# Patient Record
Sex: Female | Born: 1940
Health system: Southern US, Community
[De-identification: ages and names within clinical notes are randomized; demographics above are authoritative.]

## PROBLEM LIST (undated history)

## (undated) DIAGNOSIS — M8580 Other specified disorders of bone density and structure, unspecified site: Secondary | ICD-10-CM

## (undated) DIAGNOSIS — E785 Hyperlipidemia, unspecified: Secondary | ICD-10-CM

## (undated) DIAGNOSIS — K219 Gastro-esophageal reflux disease without esophagitis: Secondary | ICD-10-CM

## (undated) DIAGNOSIS — M810 Age-related osteoporosis without current pathological fracture: Secondary | ICD-10-CM

## (undated) HISTORY — DX: Hyperlipidemia, unspecified: E78.5

## (undated) HISTORY — DX: Other specified disorders of bone density and structure, unspecified site: M85.80

## (undated) HISTORY — PX: ABDOMINAL HYSTERECTOMY: SHX81

## (undated) HISTORY — DX: Gastro-esophageal reflux disease without esophagitis: K21.9

## (undated) HISTORY — DX: Age-related osteoporosis without current pathological fracture: M81.0

---

## 1997-07-16 ENCOUNTER — Other Ambulatory Visit: Admission: RE | Admit: 1997-07-16 | Discharge: 1997-07-16 | Payer: Self-pay

## 1997-12-25 ENCOUNTER — Ambulatory Visit (HOSPITAL_COMMUNITY): Admission: RE | Admit: 1997-12-25 | Discharge: 1997-12-25 | Payer: Self-pay

## 1998-01-04 ENCOUNTER — Ambulatory Visit (HOSPITAL_BASED_OUTPATIENT_CLINIC_OR_DEPARTMENT_OTHER): Admission: RE | Admit: 1998-01-04 | Discharge: 1998-01-04 | Payer: Self-pay | Admitting: *Deleted

## 2002-09-19 ENCOUNTER — Other Ambulatory Visit: Admission: RE | Admit: 2002-09-19 | Discharge: 2002-09-19 | Payer: Self-pay | Admitting: Family Medicine

## 2004-04-26 ENCOUNTER — Encounter: Admission: RE | Admit: 2004-04-26 | Discharge: 2004-04-26 | Payer: Self-pay | Admitting: Orthopedic Surgery

## 2005-03-27 ENCOUNTER — Other Ambulatory Visit: Admission: RE | Admit: 2005-03-27 | Discharge: 2005-03-27 | Payer: Self-pay | Admitting: Family Medicine

## 2010-01-29 ENCOUNTER — Encounter: Payer: Self-pay | Admitting: Gastroenterology

## 2010-02-24 ENCOUNTER — Ambulatory Visit: Admit: 2010-02-24 | Payer: Self-pay | Admitting: Gastroenterology

## 2010-03-06 NOTE — Letter (Signed)
Summary: Pre Visit Letter Revised  [facility]  [address] [place], [place] [address]   Phone: [phone]  Fax: [phone]        01/29/2010 MRN: [id number]  Jodi Allen [address] [place], [place]  [address]             Procedure Date:  03-10-2010 10:30am            Direct Colon - Dr Russella Dar   Welcome to the Gastroenterology Division at [facility].    You are scheduled to see a nurse for your pre-procedure visit on 02-24-10 at 10:30am on the 3rd floor at [facility], [address]  We ask that you try to arrive at our office 15 minutes prior to your appointment time to allow for check-in.  Please take a minute to review the attached form.  If you answer "Yes" to one or more of the questions on the first page, we ask that you call the person listed at your earliest opportunity.  If you answer "No" to all of the questions, please complete the rest of the form and bring it to your appointment.    Your nurse visit will consist of discussing your medical and surgical history, your immediate family medical history, and your medications.   If you are unable to list all of your medications on the form, please bring the medication bottles to your appointment and we will list them.  We will need to be aware of both prescribed and over the counter drugs.  We will need to know exact dosage information as well.    Please be prepared to read and sign documents such as consent forms, a financial agreement, and acknowledgement forms.  If necessary, and with your consent, a friend or relative is welcome to sit-in on the nurse visit with you.  Please bring your insurance card so that we may make a copy of it.  If your insurance requires a referral to see a specialist, please bring your referral form from your primary care physician.  No co-pay is required for this nurse visit.     If you cannot keep your appointment, please call [phone] to cancel or reschedule prior  to your appointment date.  This allows [place] the opportunity to schedule an appointment for another patient in need of care.    Thank you for choosing [facility] for your medical needs.  We appreciate the opportunity to care for you.  Please visit [place] at our website  to learn more about our practice.  Sincerely, The Gastroenterology Division

## 2010-03-10 ENCOUNTER — Other Ambulatory Visit: Payer: Self-pay | Admitting: Gastroenterology

## 2012-05-05 ENCOUNTER — Telehealth: Payer: Self-pay | Admitting: Nurse Practitioner

## 2012-05-06 NOTE — Telephone Encounter (Signed)
PT MAY CALL BACK AT HER CONVINCE. 

## 2012-05-25 ENCOUNTER — Telehealth: Payer: Self-pay | Admitting: Physician Assistant

## 2012-05-25 NOTE — Telephone Encounter (Signed)
appt moved  °

## 2012-05-31 ENCOUNTER — Ambulatory Visit (INDEPENDENT_AMBULATORY_CARE_PROVIDER_SITE_OTHER): Payer: Medicare Other

## 2012-05-31 ENCOUNTER — Encounter: Payer: Self-pay | Admitting: General Practice

## 2012-05-31 ENCOUNTER — Ambulatory Visit (INDEPENDENT_AMBULATORY_CARE_PROVIDER_SITE_OTHER): Payer: Medicare Other | Admitting: General Practice

## 2012-05-31 VITALS — BP 130/73 | HR 61 | Temp 96.7°F | Ht 64.0 in | Wt 213.0 lb

## 2012-05-31 DIAGNOSIS — M25559 Pain in unspecified hip: Secondary | ICD-10-CM

## 2012-05-31 DIAGNOSIS — M8580 Other specified disorders of bone density and structure, unspecified site: Secondary | ICD-10-CM | POA: Insufficient documentation

## 2012-05-31 DIAGNOSIS — M25552 Pain in left hip: Secondary | ICD-10-CM

## 2012-05-31 DIAGNOSIS — R109 Unspecified abdominal pain: Secondary | ICD-10-CM

## 2012-05-31 LAB — POCT URINALYSIS DIPSTICK
Bilirubin, UA: NEGATIVE
Glucose, UA: NEGATIVE
Ketones, UA: NEGATIVE
Leukocytes, UA: NEGATIVE
Nitrite, UA: NEGATIVE
Protein, UA: NEGATIVE
Spec Grav, UA: 1.015
Urobilinogen, UA: NEGATIVE
pH, UA: 8

## 2012-05-31 LAB — POCT UA - MICROSCOPIC ONLY
Casts, Ur, LPF, POC: NEGATIVE
Crystals, Ur, HPF, POC: NEGATIVE
Mucus, UA: NEGATIVE
WBC, Ur, HPF, POC: NEGATIVE
Yeast, UA: NEGATIVE

## 2012-05-31 NOTE — Progress Notes (Signed)
  Subjective:    Patient ID: Jodi Allen, female    DOB: 06-15-1940, 72 y.o.   MRN: [id number]  HPI Presents today with complaints of left flank pain, periodically. Denies currently having pain in side, last felt pain one day last week. Reports pain has sudden onset, aching. Reports taking advil or aleve with relieve pain. Reports frequent urination, denies burning or bloody urine. Reports being seen at urgent care on 05/05/12 and received septra DS, prior to that was seen @ [facility] on 02/23/12 and macrobid was given.     Review of Systems  Constitutional: Negative for fever and chills.  Respiratory: Negative for chest tightness and shortness of breath.   Cardiovascular: Negative for chest pain and palpitations.  Genitourinary: Positive for flank pain. Negative for dysuria and difficulty urinating.       Periodically, last occurrence was last week  Musculoskeletal: Negative for back pain.  Skin: Negative.   Neurological: Negative for dizziness and headaches.       Objective:   Physical Exam  Constitutional: She is oriented to person, place, and time. She appears well-developed and well-nourished.  Cardiovascular: Normal rate, regular rhythm and normal heart sounds.   No murmur heard. Pulmonary/Chest: Effort normal. No respiratory distress. She exhibits no tenderness.  Abdominal: Soft. Bowel sounds are normal. She exhibits no distension and no mass. There is no tenderness. There is no rebound and no guarding.  Pain upon palpation in left lower abdomen and pelvic area  Neurological: She is alert and oriented to person, place, and time.  Skin: Skin is warm and dry.  Psychiatric: She has a normal mood and affect.   [facility] reading (PRIMARY) by Ruthell Rummage, FNP-C, no acute findings.                              Results for orders placed in visit on 05/31/12  POCT UA - MICROSCOPIC ONLY      Result Value Range   WBC, Ur, HPF, POC neg     RBC, urine, microscopic 1-3     Bacteria, U  Microscopic occ     Mucus, UA neg     Epithelial cells, urine per micros few     Crystals, Ur, HPF, POC neg     Casts, Ur, LPF, POC neg     Yeast, UA neg    POCT URINALYSIS DIPSTICK      Result Value Range   Color, UA yellow     Clarity, UA cleare     Glucose, UA neg     Bilirubin, UA neg     Ketones, UA neg     Spec Grav, UA 1.015     Blood, UA trace     pH, UA 8.0     Protein, UA neg     Urobilinogen, UA negative     Nitrite, UA neg     Leukocytes, UA Negative           Assessment & Plan:  Increase fluid intake Void more frequently Continue healthy eating habits and exercise May take aleve or tylenol for mild pain or discomfort  RTO if symptoms return, will refer to CT scan Patient to schedule PAP and mammogram Patient verbalized understanding Raymon Mutton, FNP-C

## 2012-05-31 NOTE — Patient Instructions (Addendum)
Pelvic Pain Pelvic pain is pain below the belly button and located between your hips. Acute pain may last a few hours or days. Chronic pelvic pain may last weeks and months. The cause may be different for different types of pain. The pain may be dull or sharp, mild or severe and can interfere with your daily activities. Write down and tell your caregiver:   Exactly where the pain is located.  If it comes and goes or is there all the time.  When it happens (with sex, urination, bowel movement, etc.)  If the pain is related to your menstrual period or stress. Your caregiver will take a full history and do a complete physical exam and Pap test. CAUSES   Painful menstrual periods (dysmenorrhea).  Normal ovulation (Mittelschmertz) that occurs in the middle of the menstrual cycle every month.  The pelvic organs get engorged with blood just before the menstrual period (pelvic congestive syndrome).  Scar tissue from an infection or past surgery (pelvic adhesions).  Cancer of the female pelvic organs. When there is pain with cancer, it has been there for a long time.  The lining of the uterus (endometrium) abnormally grows in places like the pelvis and on the pelvic organs (endometriosis).  A form of endometriosis with the lining of the uterus present inside of the muscle tissue of the uterus (adenomyosis).  Fibroid tumor (noncancerous) in the uterus.  Bladder problems such as infection, bladder spasms of the muscle tissue of the bladder.  Intestinal problems (irritable bowel syndrome, colitis, an ulcer or gastrointestinal infection).  Polyps of the cervix or uterus.  Pregnancy in the tube (ectopic pregnancy).  The opening of the cervix is too small for the menstrual blood to flow through it (cervical stenosis).  Physical or sexual abuse (past or present).  Musculo-skeletal problems from poor posture, problems with the vertebrae of the lower back or the uterine pelvic muscles falling  (prolapse).  Psychological problems such as depression or stress.  IUD (intrauterine device) in the uterus. DIAGNOSIS  Tests to make a diagnosis depends on the type, location, severity and what causes the pain to occur. Tests that may be needed include:  Blood tests.  Urine tests  Ultrasound.  X-rays.  CT Scan.  MRI.  Laparoscopy.  Major surgery. TREATMENT  Treatment will depend on the cause of the pain, which includes:  Prescription or over-the-counter pain medication.  Antibiotics.  Birth control pills.  Hormone treatment.  Nerve blocking injections.  Physical therapy.  Antidepressants.  Counseling with a psychiatrist or psychologist.  Minor or major surgery. HOME CARE INSTRUCTIONS   Only take over-the-counter or prescription medicines for pain, discomfort or fever as directed by your caregiver.  Follow your caregiver's advice to treat your pain.  Rest.  Avoid sexual intercourse if it causes the pain.  Apply warm or cold compresses (which ever works best) to the pain area.  Do relaxation exercises such as yoga or meditation.  Try acupuncture.  Avoid stressful situations.  Try group therapy.  If the pain is because of a stomach/intestinal upset, drink clear liquids, eat a bland light food diet until the symptoms go away. SEEK MEDICAL CARE IF:   You need stronger prescription pain medication.  You develop pain with sexual intercourse.  You have pain with urination.  You develop a temperature of 102 F (38.9 C) with the pain.  You are still in pain after 4 hours of taking prescription medication for the pain.  You need depression medication.    Your IUD is causing pain and you want it removed. SEEK IMMEDIATE MEDICAL CARE IF:  You develop very severe pain or tenderness.  You faint, have chills, severe weakness or dehydration.  You develop heavy vaginal bleeding or passing solid tissue.  You develop a temperature of 102 F (38.9 C)  with the pain.  You have blood in the urine.  You are being physically or sexually abused.  You have uncontrolled vomiting and diarrhea.  You are depressed and afraid of harming yourself or someone else. Document Released: 02/27/2004 Document Revised: 04/13/2011 Document Reviewed: 11/24/2007 [facility] Patient Information 2013 [place], [place]. Flank Pain Flank pain refers to pain that is located on the side of the body between the upper abdomen and the back. It can be caused by many things. CAUSES  Some of the more common causes of flank pain include:  Muscle strain.  Muscle spasms.  A disease of your spine (vertebral disk disease).  A lung infection (pneumonia).  Fluid around your lungs (pulmonary edema).  A kidney infection.  Kidney stones.  A very painful skin rash on only one side of your body (shingles).  Gallbladder disease. DIAGNOSIS  Blood tests, urine tests, and X-rays may help your caregiver determine what is wrong. TREATMENT  The treatment of pain depends on the cause. Your caregiver will determine what treatment will work best for you. HOME CARE INSTRUCTIONS   Home care will depend on the cause of your pain.  Some medications may help relieve the pain. Take medication for relief of pain as directed by your caregiver.  Tell your caregiver about any changes in your pain.  Follow up with your caregiver. SEEK IMMEDIATE MEDICAL CARE IF:   Your pain is not controlled with medication.  The pain increases.  You have abdominal pain.  You have shortness of breath.  You have persistent nausea or vomiting.  You have swelling in your abdomen.  You feel faint or pass out.  You have a temperature by mouth above 102 F (38.9 C), not controlled by medicine. MAKE SURE YOU:   Understand these instructions.  Will watch your condition.  Will get help right away if you are not doing well or get worse. Document Released: 03/12/2005 Document Revised:  04/13/2011 Document Reviewed: 07/06/2009 [facility] Patient Information 2013 [place], [place].

## 2012-06-13 ENCOUNTER — Ambulatory Visit: Payer: Self-pay | Admitting: Physician Assistant

## 2012-07-19 ENCOUNTER — Ambulatory Visit (INDEPENDENT_AMBULATORY_CARE_PROVIDER_SITE_OTHER): Payer: Medicare Other | Admitting: Nurse Practitioner

## 2012-07-19 ENCOUNTER — Encounter: Payer: Self-pay | Admitting: Nurse Practitioner

## 2012-07-19 VITALS — BP 136/73 | HR 62 | Temp 95.9°F | Ht 64.5 in | Wt 216.0 lb

## 2012-07-19 DIAGNOSIS — Z0000 Encounter for general adult medical examination without abnormal findings: Secondary | ICD-10-CM

## 2012-07-19 DIAGNOSIS — Z01419 Encounter for gynecological examination (general) (routine) without abnormal findings: Secondary | ICD-10-CM

## 2012-07-19 LAB — COMPLETE METABOLIC PANEL WITH GFR
ALT: 17 U/L (ref 0–35)
AST: 22 U/L (ref 0–37)
Albumin: 4 g/dL (ref 3.5–5.2)
Alkaline Phosphatase: 83 U/L (ref 39–117)
BUN: 11 mg/dL (ref 6–23)
CO2: 28 mEq/L (ref 19–32)
Calcium: 9.3 mg/dL (ref 8.4–10.5)
Chloride: 102 mEq/L (ref 96–112)
Creat: 0.84 mg/dL (ref 0.50–1.10)
GFR, Est African American: 82 mL/min
GFR, Est Non African American: 71 mL/min
Glucose, Bld: 101 mg/dL — ABNORMAL HIGH (ref 70–99)
Potassium: 4.7 mEq/L (ref 3.5–5.3)
Sodium: 137 mEq/L (ref 135–145)
Total Bilirubin: 0.6 mg/dL (ref 0.3–1.2)
Total Protein: 7 g/dL (ref 6.0–8.3)

## 2012-07-19 LAB — POCT CBC
Granulocyte percent: 72.2 %G (ref 37–80)
HCT, POC: 42.1 % (ref 37.7–47.9)
Hemoglobin: 14.1 g/dL (ref 12.2–16.2)
Lymph, poc: 1.3 (ref 0.6–3.4)
MCH, POC: 28.5 pg (ref 27–31.2)
MCHC: 33.5 g/dL (ref 31.8–35.4)
MCV: 85.1 fL (ref 80–97)
MPV: 8.4 fL (ref 0–99.8)
POC Granulocyte: 3.8 (ref 2–6.9)
POC LYMPH PERCENT: 23.7 %L (ref 10–50)
Platelet Count, POC: 198 10*3/uL (ref 142–424)
RBC: 4.9 M/uL (ref 4.04–5.48)
RDW, POC: 13.4 %
WBC: 5.3 10*3/uL (ref 4.6–10.2)

## 2012-07-19 LAB — POCT UA - MICROSCOPIC ONLY
Casts, Ur, LPF, POC: NEGATIVE
Crystals, Ur, HPF, POC: NEGATIVE
WBC, Ur, HPF, POC: NEGATIVE
Yeast, UA: NEGATIVE

## 2012-07-19 LAB — POCT URINALYSIS DIPSTICK
Bilirubin, UA: NEGATIVE
Blood, UA: NEGATIVE
Glucose, UA: NEGATIVE
Ketones, UA: NEGATIVE
Leukocytes, UA: NEGATIVE
Nitrite, UA: NEGATIVE
Spec Grav, UA: 1.015
Urobilinogen, UA: NEGATIVE
pH, UA: 8.5

## 2012-07-19 LAB — THYROID PANEL WITH TSH: TSH: 3.983 u[IU]/mL (ref 0.350–4.500)

## 2012-07-19 NOTE — Patient Instructions (Addendum)

## 2012-07-19 NOTE — Progress Notes (Signed)
Subjective:    Patient ID: Jodi Allen, female    DOB: 28-Feb-1940, 71 y.o.   MRN: [id number]  HPI Pt here for her for routine physical with pap. Pt c/o intermittent  lower left abdominal pain. Pt states it is a cramping pain and 7 out of 10 pain when it occurs. Pt has tried tylenol 500mg  with relief. Pt has no other complaints at this time.       Review of Systems  Gastrointestinal: Positive for abdominal pain.       Intermittent lower left abdominal pain  All other systems reviewed and are negative.       Objective:   Physical Exam  Constitutional: Jodi Allen is oriented to person, place, and time. Jodi Allen appears well-developed and well-nourished.  HENT:  Head: Normocephalic.  Right Ear: Hearing, tympanic membrane, external ear and ear canal normal.  Left Ear: Hearing, tympanic membrane, external ear and ear canal normal.  Nose: Nose normal.  Mouth/Throat: Uvula is midline and oropharynx is clear and moist.  Eyes: Conjunctivae and EOM are normal. Pupils are equal, round, and reactive to light.  Neck: Normal range of motion and full passive range of motion without pain. Neck supple. No JVD present. Carotid bruit is not present. No mass and no thyromegaly present.  Cardiovascular: Normal rate, regular rhythm, normal heart sounds and intact distal pulses.   No murmur heard. Pulmonary/Chest: Effort normal and breath sounds normal. Right breast exhibits no inverted nipple, no mass, no nipple discharge, no skin change and no tenderness. Left breast exhibits no inverted nipple, no mass, no nipple discharge, no skin change and no tenderness.  Abdominal: Soft. Bowel sounds are normal. Jodi Allen exhibits no mass. There is no tenderness.  Genitourinary: Vagina normal and uterus normal. No breast swelling, tenderness, discharge or bleeding.  bimanual exam-No adnexal masses or tenderness.  Vaginal cuff intact  Musculoskeletal: Normal range of motion.  Lymphadenopathy:    Jodi Allen has no cervical  adenopathy.  Neurological: Jodi Allen is alert and oriented to person, place, and time. Jodi Allen has normal reflexes. No cranial nerve deficit.  Skin: Skin is warm and dry.  Psychiatric: Jodi Allen has a normal mood and affect. Her behavior is normal. Judgment and thought content normal.     BP 136/73  Pulse 62  Temp(Src) 95.9 F (35.5 C) (Oral)  Ht 5' 4.5" (1.638 m)  Wt 216 lb (97.977 kg)  BMI 36.52 kg/m2 Results for orders placed in visit on 07/19/12  POCT URINALYSIS DIPSTICK      Result Value Range   Color, UA yellow     Clarity, UA clear     Glucose, UA neg     Bilirubin, UA neg     Ketones, UA neg     Spec Grav, UA 1.015     Blood, UA neg     pH, UA 8.5     Protein, UA small     Urobilinogen, UA negative     Nitrite, UA neg     Leukocytes, UA Negative    POCT UA - MICROSCOPIC ONLY      Result Value Range   WBC, Ur, HPF, POC neg     RBC, urine, microscopic 1-5     Bacteria, U Microscopic few     Mucus, UA trace     Epithelial cells, urine per micros few     Crystals, Ur, HPF, POC neg     Casts, Ur, LPF, POC neg     Yeast, UA neg  Assessment & Plan:   1. Annual physical exam   2. Encounter for routine gynecological examination    Orders Placed This Encounter  Procedures  . COMPLETE METABOLIC PANEL WITH GFR  . NMR Lipoprofile with Lipids  . Thyroid Panel With TSH  . POCT urinalysis dipstick  . POCT UA - Microscopic Only  . POCT CBC   Diet and exercise encouraged Health maintence reviewed RTO prn  Mary-Margaret Daphine Deutscher, FNP

## 2012-07-20 LAB — PAP IG (IMAGE GUIDED)

## 2012-07-21 LAB — NMR LIPOPROFILE WITH LIPIDS
HDL Particle Number: 24.5 umol/L — ABNORMAL LOW (ref >=30.5)
HDL Size: 9 nm — ABNORMAL LOW (ref >=9.2)
LDL Size: 20.6 nm (ref >20.5)
Large HDL-P: 5 umol/L (ref >=4.8)
Large VLDL-P: 2.3 nmol/L (ref <=2.7)

## 2012-10-17 ENCOUNTER — Ambulatory Visit (INDEPENDENT_AMBULATORY_CARE_PROVIDER_SITE_OTHER): Payer: Medicare Other

## 2012-10-17 ENCOUNTER — Encounter: Payer: Self-pay | Admitting: Family Medicine

## 2012-10-17 ENCOUNTER — Ambulatory Visit (INDEPENDENT_AMBULATORY_CARE_PROVIDER_SITE_OTHER): Payer: Medicare Other | Admitting: Family Medicine

## 2012-10-17 VITALS — BP 164/81 | HR 68 | Temp 97.7°F | Ht 64.5 in | Wt 219.6 lb

## 2012-10-17 DIAGNOSIS — R350 Frequency of micturition: Secondary | ICD-10-CM

## 2012-10-17 DIAGNOSIS — IMO0001 Reserved for inherently not codable concepts without codable children: Secondary | ICD-10-CM

## 2012-10-17 DIAGNOSIS — R1032 Left lower quadrant pain: Secondary | ICD-10-CM

## 2012-10-17 LAB — POCT URINALYSIS DIPSTICK
Bilirubin, UA: NEGATIVE
Blood, UA: NEGATIVE
Glucose, UA: NEGATIVE
Ketones, UA: NEGATIVE
Leukocytes, UA: NEGATIVE
Nitrite, UA: NEGATIVE
Protein, UA: NEGATIVE
Spec Grav, UA: 1.015
Urobilinogen, UA: NEGATIVE
pH, UA: 7.5

## 2012-10-17 LAB — POCT CBC
Granulocyte percent: 66.8 %G (ref 37–80)
HCT, POC: 41.7 % (ref 37.7–47.9)
Hemoglobin: 14.5 g/dL (ref 12.2–16.2)
Lymph, poc: 1.1 (ref 0.6–3.4)
MCH, POC: 29.2 pg (ref 27–31.2)
MCHC: 34.9 g/dL (ref 31.8–35.4)
MCV: 83.7 fL (ref 80–97)
MPV: 7.7 fL (ref 0–99.8)
POC Granulocyte: 3.1 (ref 2–6.9)
POC LYMPH PERCENT: 23.9 %L (ref 10–50)
Platelet Count, POC: 174 10*3/uL (ref 142–424)
RBC: 5 M/uL (ref 4.04–5.48)
RDW, POC: 13.7 %
WBC: 4.6 10*3/uL (ref 4.6–10.2)

## 2012-10-17 LAB — POCT UA - MICROSCOPIC ONLY
Bacteria, U Microscopic: NEGATIVE
Casts, Ur, LPF, POC: NEGATIVE
Crystals, Ur, HPF, POC: NEGATIVE
Mucus, UA: NEGATIVE
RBC, urine, microscopic: NEGATIVE
WBC, Ur, HPF, POC: NEGATIVE

## 2012-10-17 MED ORDER — MELOXICAM 15 MG PO TABS: 15.0000 mg | ORAL_TABLET | Freq: Every day | ORAL | Status: DC

## 2012-10-17 NOTE — Progress Notes (Signed)
Patient ID: Jodi Allen, female   DOB: 11-23-1940, 72 y.o.   MRN: [id number] SUBJECTIVE: CC: Chief Complaint  Patient presents with  . Follow-up    pain left lower side states urinating frequently  and c/o dizzy     HPI: Pain in the left side for 1 month . Goes and comes. Pain is better with aleve. 7/10 on pain scale. Frequency of urination. No hematuria. Npo h/o of kidney stones.  Fever: none. N/V none. No change in BM.    Past Medical History  Diagnosis Date  . Osteopenia   . Osteoporosis    Past Surgical History  Procedure Laterality Date  . Abdominal hysterectomy     History   Social History  . Marital Status: Married    Spouse Name: N/A    Number of Children: N/A  . Years of Education: N/A   Occupational History  . Not on file.   Social History Main Topics  . Smoking status: Never Smoker   . Smokeless tobacco: Not on file  . Alcohol Use: No  . Drug Use: No  . Sexual Activity: Not on file   Other Topics Concern  . Not on file   Social History Narrative  . No narrative on file   Family History  Problem Relation Age of Onset  . Cancer Mother     BREAST REMOVED   No current outpatient prescriptions on file prior to visit.   No current facility-administered medications on file prior to visit.   No Known Allergies  There is no immunization history on file for this patient. Prior to Admission medications   Not on File     ROS: As above in the HPI. All other systems are stable or negative.  OBJECTIVE: APPEARANCE:  Patient in no acute distress.The patient appeared well nourished and normally developed. Acyanotic. Waist: VITAL SIGNS:BP 164/81  Pulse 68  Temp(Src) 97.7 F (36.5 C) (Oral)  Ht 5' 4.5" (1.638 m)  Wt 219 lb 9.6 oz (99.61 kg)  BMI 37.13 kg/m2 AAF  SKIN: warm and  Dry without overt rashes, tattoos and scars  HEAD and Neck: without JVD, Head and scalp: normal Eyes:No scleral icterus. Fundi normal, eye movements  normal. Ears: Auricle normal, canal normal, Tympanic membranes normal, insufflation normal. Nose: normal Throat: normal Neck & thyroid: normal  CHEST & LUNGS: Chest wall: normal Lungs: Clear  CVS: Reveals the PMI to be normally located. Regular rhythm, First and Second Heart sounds are normal,  absence of murmurs, rubs or gallops. Peripheral vasculature: Radial pulses: normal Dorsal pedis pulses: normal Posterior pulses: normal  ABDOMEN:  Appearance: obese Mild LLQ tenderness , no organomegaly, no masses, no Abdominal Aortic enlargement. No Guarding , no rebound. No Bruits. Bowel sounds: normal  RECTAL: N/A GU: N/A  EXTREMETIES: nonedematous.  MUSCULOSKELETAL:  Spine: normal Joints: intact  NEUROLOGIC: oriented to time,place and person; nonfocal.  ASSESSMENT: Frequency - Plan: POCT UA - Microscopic Only, POCT urinalysis dipstick, POCT CBC, DG Abd 2 Views  Abdominal pain, left lower quadrant - Plan: POCT CBC, DG Abd 2 Views, meloxicam (MOBIC) 15 MG tablet   PLAN: [facility] reading (PRIMARY) by  Dr. Modesto Charon: scoliosis, Degenerative changes , no acute findings, phleboliths, no kidney stones seen.                             Orders Placed This Encounter  Procedures  . DG Abd 2 Views    Standing Status:  Future     Number of Occurrences: 1     Standing Expiration Date: 12/17/2013    Order Specific Question:  Reason for Exam (SYMPTOM  OR DIAGNOSIS REQUIRED)    Answer:  LLQ abdominal pain.    Order Specific Question:  Preferred imaging location?    Answer:  Internal  . POCT UA - Microscopic Only  . POCT urinalysis dipstick  . POCT CBC   Results for orders placed in visit on 10/17/12  POCT UA - MICROSCOPIC ONLY      Result Value Range   WBC, Ur, HPF, POC neg     RBC, urine, microscopic neg     Bacteria, U Microscopic neg     Mucus, UA neg     Epithelial cells, urine per micros occ     Crystals, Ur, HPF, POC neg     Casts, Ur, LPF, POC neg     Yeast, UA mod    POCT  URINALYSIS DIPSTICK      Result Value Range   Color, UA yellow     Clarity, UA clear     Glucose, UA neg     Bilirubin, UA neg     Ketones, UA neg     Spec Grav, UA 1.015     Blood, UA neg     pH, UA 7.5     Protein, UA neg     Urobilinogen, UA negative     Nitrite, UA neg     Leukocytes, UA Negative    POCT CBC      Result Value Range   WBC 4.6  4.6 - 10.2 K/uL   Lymph, poc 1.1  0.6 - 3.4   POC LYMPH PERCENT 23.9  10 - 50 %L   POC Granulocyte 3.1  2 - 6.9   Granulocyte percent 66.8  37 - 80 %G   RBC 5.0  4.04 - 5.48 M/uL   Hemoglobin 14.5  12.2 - 16.2 g/dL   HCT, POC 16.1  09.6 - 47.9 %   MCV 83.7  80 - 97 fL   MCH, POC 29.2  27 - 31.2 pg   MCHC 34.9  31.8 - 35.4 g/dL   RDW, POC 04.5     Platelet Count, POC 174.0  142 - 424 K/uL   MPV 7.7  0 - 99.8 fL   Meds ordered this encounter  Medications  . meloxicam (MOBIC) 15 MG tablet    Sig: Take 1 tablet (15 mg total) by mouth daily.    Dispense:  30 tablet    Refill:  0   Increased fluids.  Return in about 3 days (around 10/20/2012) for recheck BP, Recheck medical problems.  Yovana Scogin P. Modesto Charon, M.D.

## 2012-10-20 ENCOUNTER — Encounter: Payer: Self-pay | Admitting: Family Medicine

## 2012-10-20 ENCOUNTER — Ambulatory Visit (INDEPENDENT_AMBULATORY_CARE_PROVIDER_SITE_OTHER): Payer: Medicare Other | Admitting: Family Medicine

## 2012-10-20 VITALS — BP 136/75 | HR 64 | Temp 97.1°F | Ht 64.5 in | Wt 219.4 lb

## 2012-10-20 DIAGNOSIS — R1032 Left lower quadrant pain: Secondary | ICD-10-CM | POA: Insufficient documentation

## 2012-10-20 DIAGNOSIS — M8580 Other specified disorders of bone density and structure, unspecified site: Secondary | ICD-10-CM

## 2012-10-20 DIAGNOSIS — M899 Disorder of bone, unspecified: Secondary | ICD-10-CM

## 2012-10-20 NOTE — Progress Notes (Signed)
Patient ID: Jodi Allen, female   DOB: 04/14/1940, 72 y.o.   MRN: [id number] SUBJECTIVE: CC: Chief Complaint  Patient presents with  . Follow-up    reck pian left side doing better    HPI: Pain almost totally gone.  Past Medical History  Diagnosis Date  . Osteopenia   . Osteoporosis    Past Surgical History  Procedure Laterality Date  . Abdominal hysterectomy     History   Social History  . Marital Status: Married    Spouse Name: N/A    Number of Children: N/A  . Years of Education: N/A   Occupational History  . Not on file.   Social History Main Topics  . Smoking status: Never Smoker   . Smokeless tobacco: Not on file  . Alcohol Use: No  . Drug Use: No  . Sexual Activity: Not on file   Other Topics Concern  . Not on file   Social History Narrative  . No narrative on file   Family History  Problem Relation Age of Onset  . Cancer Mother     BREAST REMOVED   Current Outpatient Prescriptions on File Prior to Visit  Medication Sig Dispense Refill  . meloxicam (MOBIC) 15 MG tablet Take 1 tablet (15 mg total) by mouth daily.  30 tablet  0   No current facility-administered medications on file prior to visit.   No Known Allergies  There is no immunization history on file for this patient. Prior to Admission medications   Medication Sig Start Date End Date Taking? Authorizing Provider  meloxicam (MOBIC) 15 MG tablet Take 1 tablet (15 mg total) by mouth daily. 10/17/12  Yes Ileana Ladd, MD     ROS: As above in the HPI. All other systems are stable or negative.  OBJECTIVE: APPEARANCE:  Patient in no acute distress.The patient appeared well nourished and normally developed. Acyanotic. Waist: VITAL SIGNS:BP 136/75  Pulse 64  Temp(Src) 97.1 F (36.2 C) (Oral)  Ht 5' 4.5" (1.638 m)  Wt 219 lb 6.4 oz (99.519 kg)  BMI 37.09 kg/m2 AAF obese  SKIN: warm and  Dry without overt rashes, tattoos and scars  HEAD and Neck: without JVD, Head and  scalp: normal Eyes:No scleral icterus. Fundi normal, eye movements normal. Ears: Auricle normal, canal normal, Tympanic membranes normal, insufflation normal. Nose: normal Throat: normal Neck & thyroid: normal  CHEST & LUNGS: Chest wall: normal Lungs: Clear  CVS: Reveals the PMI to be normally located. Regular rhythm, First and Second Heart sounds are normal,  absence of murmurs, rubs or gallops. Peripheral vasculature: Radial pulses: normal Dorsal pedis pulses: normal Posterior pulses: normal  ABDOMEN:  Appearance: normal,obese soft Benign, no organomegaly, no masses, no Abdominal Aortic enlargement. No Guarding , no rebound. No Bruits. Bowel sounds: normal  RECTAL: N/A GU: N/A  EXTREMETIES: nonedematous.  MUSCULOSKELETAL:  Spine: normal Joints: intact  NEUROLOGIC: oriented to time,place and person; nonfocal. Strength is normal Sensory is normal Reflexes are normal Cranial Nerves are normal.  Results for orders placed in visit on 10/17/12  POCT UA - MICROSCOPIC ONLY      Result Value Range   WBC, Ur, HPF, POC neg     RBC, urine, microscopic neg     Bacteria, U Microscopic neg     Mucus, UA neg     Epithelial cells, urine per micros occ     Crystals, Ur, HPF, POC neg     Casts, Ur, LPF, POC neg  Yeast, UA mod    POCT URINALYSIS DIPSTICK      Result Value Range   Color, UA yellow     Clarity, UA clear     Glucose, UA neg     Bilirubin, UA neg     Ketones, UA neg     Spec Grav, UA 1.015     Blood, UA neg     pH, UA 7.5     Protein, UA neg     Urobilinogen, UA negative     Nitrite, UA neg     Leukocytes, UA Negative    POCT CBC      Result Value Range   WBC 4.6  4.6 - 10.2 K/uL   Lymph, poc 1.1  0.6 - 3.4   POC LYMPH PERCENT 23.9  10 - 50 %L   POC Granulocyte 3.1  2 - 6.9   Granulocyte percent 66.8  37 - 80 %G   RBC 5.0  4.04 - 5.48 M/uL   Hemoglobin 14.5  12.2 - 16.2 g/dL   HCT, POC 16.1  09.6 - 47.9 %   MCV 83.7  80 - 97 fL   MCH, POC  29.2  27 - 31.2 pg   MCHC 34.9  31.8 - 35.4 g/dL   RDW, POC 04.5     Platelet Count, POC 174.0  142 - 424 K/uL   MPV 7.7  0 - 99.8 fL    ASSESSMENT: Abdominal pain, left lower quadrant - resolved  Osteopenia   PLAN: Observe for now.  Return in about 3 months (around 01/19/2013) for follow up with [facility].  Amey Hossain P. Modesto Charon, M.D.

## 2013-04-14 ENCOUNTER — Ambulatory Visit (INDEPENDENT_AMBULATORY_CARE_PROVIDER_SITE_OTHER): Payer: Commercial Managed Care - HMO

## 2013-04-14 ENCOUNTER — Ambulatory Visit (INDEPENDENT_AMBULATORY_CARE_PROVIDER_SITE_OTHER): Payer: Commercial Managed Care - HMO | Admitting: General Practice

## 2013-04-14 ENCOUNTER — Encounter: Payer: Self-pay | Admitting: General Practice

## 2013-04-14 VITALS — BP 162/73 | HR 60 | Temp 96.8°F | Ht 64.5 in | Wt 220.6 lb

## 2013-04-14 DIAGNOSIS — K219 Gastro-esophageal reflux disease without esophagitis: Secondary | ICD-10-CM

## 2013-04-14 DIAGNOSIS — R1013 Epigastric pain: Secondary | ICD-10-CM

## 2013-04-14 DIAGNOSIS — R079 Chest pain, unspecified: Secondary | ICD-10-CM

## 2013-04-14 MED ORDER — OMEPRAZOLE 20 MG PO CPDR: 20.0000 mg | DELAYED_RELEASE_CAPSULE | Freq: Every day | ORAL | Status: DC

## 2013-04-14 NOTE — Progress Notes (Signed)
   Subjective:    Patient ID: Ronn MelenaGeraldine M Muecke, female    DOB: Jun 05, 1940, 73 y.o.   MRN: [id number]  Abdominal Pain This is a new problem. The current episode started in the past 7 days. The onset quality is sudden. The problem occurs 2 to 4 times per day. The problem has been gradually worsening. The pain is located in the epigastric region. The quality of the pain is aching and a sensation of fullness. The abdominal pain radiates to the epigastric region. Associated symptoms include belching. Pertinent negatives include no fever, frequency, nausea or vomiting. The pain is aggravated by eating. The pain is relieved by nothing. She has tried nothing for the symptoms. There is no history of gallstones or GERD.  Chest Pain  This is a new problem. The current episode started in the past 7 days. The onset quality is sudden. The problem occurs 2 to 4 times per day. The problem has been unchanged. The pain is present in the epigastric region. The pain is at a severity of 6/10. The quality of the pain is described as burning. The pain does not radiate. Associated symptoms include abdominal pain. Pertinent negatives include no fever, irregular heartbeat, nausea, near-syncope, numbness, palpitations, shortness of breath, syncope or vomiting.      Review of Systems  Constitutional: Negative for fever and chills.  Respiratory: Negative for chest tightness and shortness of breath.   Cardiovascular: Negative for chest pain, palpitations, syncope and near-syncope.  Gastrointestinal: Positive for abdominal pain. Negative for nausea and vomiting.  Genitourinary: Negative for frequency.  Neurological: Negative for numbness.       Objective:   Physical Exam  Constitutional: She is oriented to person, place, and time. She appears well-developed and well-nourished.  Cardiovascular: Normal rate, regular rhythm and normal heart sounds.   Pulmonary/Chest: Effort normal and breath sounds normal. No respiratory  distress. She exhibits no tenderness.  Abdominal: Soft. Bowel sounds are normal. She exhibits no distension. There is tenderness.  Epigastric region tenderness upon palpation  Neurological: She is alert and oriented to person, place, and time.  Skin: Skin is warm and dry.  Psychiatric: She has a normal mood and affect.     [facility] reading (PRIMARY) by Coralie KeensMae E. Olar Santini, FNP-C, no acute changes      Assessment & Plan:  1. Chest pain  - EKG 12-Lead; Standing - EKG 12-Lead - DG Chest 2 View; Future  2. Abdominal pain, epigastric   3. GERD (gastroesophageal reflux disease)  - omeprazole (PRILOSEC) 20 MG capsule; Take 1 capsule (20 mg total) by mouth daily.  Dispense: 30 capsule; Refill: 11 -discussed gerd diet and provided information -RTO prn  Patient verbalized understanding Coralie KeensMae E. Ulice Follett, FNP-C

## 2013-04-14 NOTE — Patient Instructions (Signed)

## 2013-04-19 ENCOUNTER — Other Ambulatory Visit: Payer: Self-pay | Admitting: General Practice

## 2013-04-19 DIAGNOSIS — R1013 Epigastric pain: Secondary | ICD-10-CM | POA: Insufficient documentation

## 2013-04-19 DIAGNOSIS — R079 Chest pain, unspecified: Secondary | ICD-10-CM

## 2013-04-21 ENCOUNTER — Telehealth: Payer: Self-pay | Admitting: General Practice

## 2013-05-05 ENCOUNTER — Ambulatory Visit: Payer: Self-pay | Admitting: Internal Medicine

## 2013-05-05 ENCOUNTER — Encounter: Payer: Self-pay | Admitting: *Deleted

## 2013-05-05 ENCOUNTER — Ambulatory Visit (INDEPENDENT_AMBULATORY_CARE_PROVIDER_SITE_OTHER): Payer: Commercial Managed Care - HMO | Admitting: Cardiovascular Disease

## 2013-05-05 ENCOUNTER — Encounter: Payer: Self-pay | Admitting: Cardiovascular Disease

## 2013-05-05 VITALS — BP 150/66 | HR 64 | Ht 64.0 in | Wt 217.0 lb

## 2013-05-05 DIAGNOSIS — I517 Cardiomegaly: Secondary | ICD-10-CM

## 2013-05-05 DIAGNOSIS — IMO0001 Reserved for inherently not codable concepts without codable children: Secondary | ICD-10-CM

## 2013-05-05 DIAGNOSIS — R030 Elevated blood-pressure reading, without diagnosis of hypertension: Secondary | ICD-10-CM

## 2013-05-05 DIAGNOSIS — K219 Gastro-esophageal reflux disease without esophagitis: Secondary | ICD-10-CM

## 2013-05-05 DIAGNOSIS — R079 Chest pain, unspecified: Secondary | ICD-10-CM

## 2013-05-05 NOTE — Progress Notes (Signed)
Patient ID: Jodi Allen, female   DOB: 03-20-1940, 73 y.o.   MRN: [id number]       CARDIOLOGY CONSULT NOTE  Patient ID: Jodi Allen MRN: [id number] DOB/AGE: July 13, 1940 73 y.o.  Admit date: (Not on file) Primary Physician Rudi Heap, MD  Reason for Consultation: chest pain  HPI: The patient is a 73 year old woman who has been referred by Dr. Christell Constant for the evaluation of chest pain. She was recently started on omeprazole for epigastric and abdominal discomfort and tenderness. A recent chest x-ray revealed borderline cardiomegaly and slight peribronchial thickening. An ECG performed on March 13 showed normal sinus rhythm with late R-wave transition and one PVC. A lipid profile in June 2014 showed total cholesterol 201, triglycerides 130, HDL 43, LDL 132. Therapeutic lifestyle modification was encouraged.  She says that she has experienced these symptoms for the past one month. They are somewhat relieved with omeprazole and after changing her diet and avoiding greasy foods. She exercises at the [facility] twice a week. She occasionally experiences a burning epigastric discomfort and in the lower substernal region while exercising. She denies associated shortness of breath, palpitations, leg swelling, lightheadedness, dizziness and syncope.  She said that her blood pressure "goes up and down".  Soc: Married. Several grandchildren. Smoked very little as a teenager.    No Known Allergies  Current Outpatient Prescriptions  Medication Sig Dispense Refill  . meloxicam (MOBIC) 15 MG tablet Take 1 tablet (15 mg total) by mouth daily.  30 tablet  0  . omeprazole (PRILOSEC) 20 MG capsule Take 1 capsule (20 mg total) by mouth daily.  30 capsule  11   No current facility-administered medications for this visit.    Past Medical History  Diagnosis Date  . Osteopenia   . Osteoporosis     Past Surgical History  Procedure Laterality Date  . Abdominal hysterectomy      History    Social History  . Marital Status: Married    Spouse Name: N/A    Number of Children: N/A  . Years of Education: N/A   Occupational History  . Not on file.   Social History Main Topics  . Smoking status: Never Smoker   . Smokeless tobacco: Not on file  . Alcohol Use: No  . Drug Use: No  . Sexual Activity: Not on file   Other Topics Concern  . Not on file   Social History Narrative  . No narrative on file     No family history of premature CAD in 1st degree relatives.  Prior to Admission medications   Medication Sig Start Date End Date Taking? Authorizing Provider  meloxicam (MOBIC) 15 MG tablet Take 1 tablet (15 mg total) by mouth daily. 10/17/12   Ileana Ladd, MD  omeprazole (PRILOSEC) 20 MG capsule Take 1 capsule (20 mg total) by mouth daily. 04/14/13   Coralie Keens, FNP     Review of systems complete and found to be negative unless listed above in HPI     Physical exam BP 150/66  Pulse 64   General: NAD Neck: No JVD, no thyromegaly or thyroid nodule.  Lungs: Clear to auscultation bilaterally with normal respiratory effort. CV: Nondisplaced PMI.  Heart regular S1/S2, no S3/S4, no murmur.  No peripheral edema.  No carotid bruit.  Normal pedal pulses.  Abdomen: Soft, nontender, no hepatosplenomegaly, no distention.  Skin: Intact without lesions or rashes.  Neurologic: Alert and oriented x 3.  Psych: Normal affect. Extremities: No clubbing  or cyanosis.  HEENT: Normal.   Labs:   Lab Results  Component Value Date   WBC 4.6 10/17/2012   HGB 14.5 10/17/2012   HCT 41.7 10/17/2012   MCV 83.7 10/17/2012   No results found for this basename: NA, K, CL, CO2, BUN, CREATININE, CALCIUM, LABALBU, PROT, BILITOT, ALKPHOS, ALT, AST, GLUCOSE,  in the last 168 hours No results found for this basename: CKTOTAL, CKMB, CKMBINDEX, TROPONINI    No results found for this basename: CHOL   No results found for this basename: HDL   Lab Results  Component Value Date    LDLCALC 132* 07/19/2012   Lab Results  Component Value Date   TRIG 130 07/19/2012   No results found for this basename: CHOLHDL   No results found for this basename: LDLDIRECT         Studies: IMPRESSION: 1. Borderline cardiomegaly. 2. Slight peribronchial thickening which could be acute or chronic. 3. Minimal linear atelectasis or scarring at the lingula.    ASSESSMENT AND PLAN:  1. Chest pain: While she does have symptoms of GERD which are currently being treated, she has an abnormal ECG with a late R wave transition, and her chest x-ray showed borderline cardiomegaly. She has both typical and atypical features for heart disease. I will obtain an echocardiogram to evaluate for structural heart disease and to characterize left ventricular dimensions. She may have some degree of hypertensive heart disease. I will also obtain a stress echocardiogram to evaluate for inducible ischemia. 2. Elevated blood pressure: She has no known history of hypertension. However, up upon review of the medical records, her blood pressure was noted to be 164/81 on 10/17/2012. She also admits to BP fluctuations. I will monitor this for the time being. I may consider antihypertensive therapy should she have repeated elevated readings.  Dispo: f/u 1 month.  Signed: Prentice DockerSuresh Mikhaela Zaugg, M.D., F.A.C.C.  05/05/2013, 8:57 AM

## 2013-05-05 NOTE — Patient Instructions (Addendum)
Your physician recommends that you schedule a follow-up appointment in: 1 month after tests   Your physician has requested that you have an echocardiogram. Echocardiography is a painless test that uses sound waves to create images of your heart. It provides your doctor with information about the size and shape of your heart and how well your heart's chambers and valves are working. This procedure takes approximately one hour. There are no restrictions for this procedure.   Your physician has requested that you have a stress echocardiogram. For further information please visit [email or web address]. Please follow instruction sheet as given.   Your physician recommends that you continue on your current medications as directed. Please refer to the Current Medication list given to you today.   Thank you for choosing [facility] !

## 2013-05-15 ENCOUNTER — Other Ambulatory Visit (HOSPITAL_COMMUNITY): Payer: Commercial Managed Care - HMO

## 2013-05-17 ENCOUNTER — Ambulatory Visit (HOSPITAL_COMMUNITY)
Admission: RE | Admit: 2013-05-17 | Discharge: 2013-05-17 | Disposition: A | Discharge status: Home / Self Care | Payer: Medicare HMO | Source: Ambulatory Visit | Attending: Cardiovascular Disease | Admitting: Cardiovascular Disease

## 2013-05-17 ENCOUNTER — Other Ambulatory Visit (HOSPITAL_COMMUNITY): Payer: Commercial Managed Care - HMO

## 2013-05-17 ENCOUNTER — Encounter (HOSPITAL_COMMUNITY): Payer: Self-pay

## 2013-05-17 ENCOUNTER — Inpatient Hospital Stay (HOSPITAL_COMMUNITY): Admission: RE | Admit: 2013-05-17 | Payer: Commercial Managed Care - HMO | Source: Ambulatory Visit

## 2013-05-17 DIAGNOSIS — I059 Rheumatic mitral valve disease, unspecified: Secondary | ICD-10-CM | POA: Insufficient documentation

## 2013-05-17 DIAGNOSIS — I519 Heart disease, unspecified: Secondary | ICD-10-CM | POA: Diagnosis not present

## 2013-05-17 DIAGNOSIS — K219 Gastro-esophageal reflux disease without esophagitis: Secondary | ICD-10-CM | POA: Insufficient documentation

## 2013-05-17 DIAGNOSIS — I498 Other specified cardiac arrhythmias: Secondary | ICD-10-CM | POA: Insufficient documentation

## 2013-05-17 DIAGNOSIS — I517 Cardiomegaly: Secondary | ICD-10-CM

## 2013-05-17 DIAGNOSIS — I4949 Other premature depolarization: Secondary | ICD-10-CM | POA: Insufficient documentation

## 2013-05-17 DIAGNOSIS — R079 Chest pain, unspecified: Secondary | ICD-10-CM | POA: Insufficient documentation

## 2013-05-17 DIAGNOSIS — R072 Precordial pain: Secondary | ICD-10-CM

## 2013-05-17 DIAGNOSIS — I10 Essential (primary) hypertension: Secondary | ICD-10-CM | POA: Insufficient documentation

## 2013-05-17 DIAGNOSIS — Z6837 Body mass index (BMI) 37.0-37.9, adult: Secondary | ICD-10-CM | POA: Insufficient documentation

## 2013-05-17 NOTE — Progress Notes (Signed)
*  PRELIMINARY RESULTS* Echocardiogram 2D Echocardiogram has been performed.  Jodi Allen 05/17/2013, 10:27 AM

## 2013-05-17 NOTE — Progress Notes (Signed)
*  PRELIMINARY RESULTS* Echocardiogram Echocardiogram Stress Test has been performed.  Zaneta Lightcap L Elnathan Fulford 05/17/2013, 11:13 AM

## 2013-05-17 NOTE — Progress Notes (Signed)
Stress Lab Nurses Notes - Jodi Allen  [name] 05/17/2013 Reason for doing test: Chest Pain Type of test: Stress Echo Nurse performing test: Parke PoissonPhyllis Billingsly, RN Nuclear Medicine Tech: Not Applicable Echo Tech: Veda Canningindy Rigg MD performing test: S. McDowell/K.Lyman BishopLawrence NP Family MD: Dr. Christell ConstantMoore Test explained and consent signed: yes IV started: No IV started Symptoms: SOB & leg Fatigue Treatment/Intervention: None Reason test stopped: fatigue and reached target HR After recovery IV was: NA Patient to return to Nuc. Med at :NA Patient discharged: Home Patient's Condition upon discharge was: stable Comments: During test peak BP 155/90 & HR 152.  Recovery BP 144/73 & HR 68.  Symptoms resolved in recovery. Tawni MillersPhyllis T Layanna Charo

## 2013-06-05 ENCOUNTER — Ambulatory Visit (INDEPENDENT_AMBULATORY_CARE_PROVIDER_SITE_OTHER): Payer: Commercial Managed Care - HMO | Admitting: Cardiovascular Disease

## 2013-06-05 ENCOUNTER — Encounter (INDEPENDENT_AMBULATORY_CARE_PROVIDER_SITE_OTHER): Payer: Self-pay

## 2013-06-05 ENCOUNTER — Encounter: Payer: Self-pay | Admitting: Cardiovascular Disease

## 2013-06-05 VITALS — BP 139/68 | HR 61 | Ht 64.0 in | Wt 219.4 lb

## 2013-06-05 DIAGNOSIS — I340 Nonrheumatic mitral (valve) insufficiency: Secondary | ICD-10-CM

## 2013-06-05 DIAGNOSIS — R030 Elevated blood-pressure reading, without diagnosis of hypertension: Secondary | ICD-10-CM

## 2013-06-05 DIAGNOSIS — IMO0001 Reserved for inherently not codable concepts without codable children: Secondary | ICD-10-CM

## 2013-06-05 DIAGNOSIS — I059 Rheumatic mitral valve disease, unspecified: Secondary | ICD-10-CM

## 2013-06-05 DIAGNOSIS — Z136 Encounter for screening for cardiovascular disorders: Secondary | ICD-10-CM

## 2013-06-05 DIAGNOSIS — K219 Gastro-esophageal reflux disease without esophagitis: Secondary | ICD-10-CM

## 2013-06-05 DIAGNOSIS — R079 Chest pain, unspecified: Secondary | ICD-10-CM

## 2013-06-05 NOTE — Patient Instructions (Signed)
Your physician wants you to follow-up in: 1 year You will receive a reminder letter in the mail two months in advance. If you don't receive a letter, please call our office to schedule the follow-up appointment.    Your physician recommends that you continue on your current medications as directed. Please refer to the Current Medication list given to you today.     Thank you for choosing [facility] !  

## 2013-06-05 NOTE — Progress Notes (Signed)
Patient ID: Ronn MelenaGeraldine M Placzek, female   DOB: 10-Aug-1940, 73 y.o.   MRN: [id number]      SUBJECTIVE: The patient is here to followup on the results of cardiovascular testing performed for the evaluation of chest pain. Echocardiography demonstrated normal left ventricular systolic function, EF 55-60%, normal chamber size and wall thickness, grade 1 diastolic dysfunction, and mild to moderate mitral regurgitation. Stress echocardiography did not demonstrate any evidence of inducible ischemia. She has had no further episodes of chest pain since starting omeprazole and doing her best to avoid greasy foods.    No Known Allergies  Current Outpatient Prescriptions  Medication Sig Dispense Refill  . omeprazole (PRILOSEC) 20 MG capsule Take 20 mg by mouth as needed.       No current facility-administered medications for this visit.    Past Medical History  Diagnosis Date  . Osteopenia   . Osteoporosis     Past Surgical History  Procedure Laterality Date  . Abdominal hysterectomy      History   Social History  . Marital Status: Married    Spouse Name: N/A    Number of Children: N/A  . Years of Education: N/A   Occupational History  . Not on file.   Social History Main Topics  . Smoking status: Never Smoker   . Smokeless tobacco: Not on file  . Alcohol Use: No  . Drug Use: No  . Sexual Activity: Not on file   Other Topics Concern  . Not on file   Social History Narrative  . No narrative on file     Filed Vitals:   06/05/13 1116  BP: 139/68  Pulse: 61  Height: 5\' 4"  (1.626 m)  Weight: 219 lb 6.4 oz (99.519 kg)    PHYSICAL EXAM General: NAD Neck: No JVD, no thyromegaly. Lungs: Clear to auscultation bilaterally with normal respiratory effort. CV: Nondisplaced PMI.  Regular rate and rhythm, normal S1/S2, no S3/S4, 1/6 apical holosystolic murmur. No pretibial or periankle edema.  No carotid bruit.  Normal pedal pulses.  Abdomen: Soft, nontender, no  hepatosplenomegaly, no distention.  Neurologic: Alert and oriented x 3.  Psych: Normal affect. Extremities: No clubbing or cyanosis.   ECG: reviewed and available in electronic records.      ASSESSMENT AND PLAN: 1. Chest pain: Likely related to GERD as it has been relieved with dietary modification and the institution of omeprazole. Normal stress testing as noted above. 2. Elevated blood pressure: She has no known history of hypertension. During her last visit, BP was elevated but it is normal today. Recommend continued monitoring given her h/o BP fluctuations. 3. Mild to moderate mitral regurgitation: She is symptomatically stable. I may repeat echocardiography in 2 years. I will monitor this clinically.  Dispo: f/u 1 year.   Prentice DockerSuresh Koneswaran, M.D., F.A.C.C.

## 2013-08-02 ENCOUNTER — Ambulatory Visit (INDEPENDENT_AMBULATORY_CARE_PROVIDER_SITE_OTHER): Payer: Commercial Managed Care - HMO

## 2013-08-02 ENCOUNTER — Ambulatory Visit (INDEPENDENT_AMBULATORY_CARE_PROVIDER_SITE_OTHER): Payer: Commercial Managed Care - HMO | Admitting: Physician Assistant

## 2013-08-02 VITALS — BP 156/80 | HR 73 | Temp 97.7°F | Ht 64.0 in | Wt 219.0 lb

## 2013-08-02 DIAGNOSIS — R1032 Left lower quadrant pain: Secondary | ICD-10-CM

## 2013-08-02 NOTE — Patient Instructions (Signed)

## 2013-08-02 NOTE — Progress Notes (Signed)
Subjective:     Patient ID: Jodi Allen, female   DOB: 02/07/1940, 73 y.o.   MRN: [id number]  HPI Pt with a several month hx of intermit LLQ abd pain Has prev bee seen for same Had a cardiac WU that was neg Pt also was on PPI for a period of time which helped at first but sx returned   Review of Systems  Constitutional: Negative.   Respiratory: Negative.   Cardiovascular: Negative.   Gastrointestinal: Positive for abdominal pain and constipation. Negative for nausea, vomiting, diarrhea, abdominal distention and rectal pain.       Objective:   Physical Exam  Cardiovascular: Normal rate, regular rhythm and normal heart sounds.   Pulmonary/Chest: Effort normal and breath sounds normal.  Abdominal: Soft. Bowel sounds are normal. She exhibits no distension and no mass. There is tenderness. There is no rebound and no guarding.  TTP LLQ  KUB- gas and stool noted     Assessment:     LLQ abd pain    Plan:     OTC Milk of Mag Prune juice Increase fluid intake F/U prn

## 2013-10-06 ENCOUNTER — Telehealth: Payer: Self-pay | Admitting: Family Medicine

## 2013-10-06 NOTE — Telephone Encounter (Signed)
appt given for Tuesday with Paulene Floor, FNP

## 2013-10-10 ENCOUNTER — Encounter: Payer: Self-pay | Admitting: Nurse Practitioner

## 2013-10-10 ENCOUNTER — Ambulatory Visit (INDEPENDENT_AMBULATORY_CARE_PROVIDER_SITE_OTHER): Payer: Commercial Managed Care - HMO

## 2013-10-10 ENCOUNTER — Ambulatory Visit (INDEPENDENT_AMBULATORY_CARE_PROVIDER_SITE_OTHER): Payer: Commercial Managed Care - HMO | Admitting: Nurse Practitioner

## 2013-10-10 VITALS — BP 149/77 | HR 64 | Temp 96.7°F | Ht 64.0 in | Wt 222.2 lb

## 2013-10-10 DIAGNOSIS — R109 Unspecified abdominal pain: Secondary | ICD-10-CM

## 2013-10-10 DIAGNOSIS — R1032 Left lower quadrant pain: Secondary | ICD-10-CM

## 2013-10-10 DIAGNOSIS — R10A0 Flank pain, unspecified side: Secondary | ICD-10-CM

## 2013-10-10 LAB — POCT URINALYSIS DIPSTICK
BILIRUBIN UA: NEGATIVE
Glucose, UA: NEGATIVE
KETONES UA: NEGATIVE
Nitrite, UA: NEGATIVE
Protein, UA: NEGATIVE
SPEC GRAV UA: 1.01
Urobilinogen, UA: NEGATIVE
pH, UA: 7

## 2013-10-10 LAB — POCT UA - MICROSCOPIC ONLY
Bacteria, U Microscopic: NEGATIVE
Casts, Ur, LPF, POC: NEGATIVE
Crystals, Ur, HPF, POC: NEGATIVE
MUCUS UA: NEGATIVE

## 2013-10-10 LAB — POCT CBC
Granulocyte percent: 71.1 %G (ref 37–80)
HCT, POC: 43.2 % (ref 37.7–47.9)
Hemoglobin: 13.9 g/dL (ref 12.2–16.2)
Lymph, poc: 1.1 (ref 0.6–3.4)
MCH, POC: 27.6 pg (ref 27–31.2)
MCHC: 32.2 g/dL (ref 31.8–35.4)
MCV: 85.8 fL (ref 80–97)
MPV: 8.4 fL (ref 0–99.8)
POC Granulocyte: 3.2 (ref 2–6.9)
POC LYMPH %: 24.1 % (ref 10–50)
Platelet Count, POC: 193 10*3/uL (ref 142–424)
RBC: 5 M/uL (ref 4.04–5.48)
RDW, POC: 13.9 %
WBC: 4.5 10*3/uL — AB (ref 4.6–10.2)

## 2013-10-10 NOTE — Patient Instructions (Signed)

## 2013-10-10 NOTE — Progress Notes (Signed)
Subjective:    Patient ID: Jodi Allen, female    DOB: 1940/08/09, 73 y.o.   MRN: [id number]  HPI Patient has left lower quadrant pain that is intermittent for the past several months that will last for a few hours and takes Ibuprofen with relief.  No complaints of diarrhea or constipation at this time.  Had a partial hysterectomy several years ago but still has both ovaries.   Review of Systems  Constitutional: Negative.   Respiratory: Negative.   Cardiovascular: Negative.   Gastrointestinal: Negative for nausea, vomiting, abdominal pain, diarrhea and constipation.  Musculoskeletal: Negative.   Skin: Negative.        Objective:   Physical Exam  Constitutional: She is oriented to person, place, and time. She appears well-developed and well-nourished.  Cardiovascular: Normal rate, regular rhythm and normal heart sounds.   Pulmonary/Chest: Effort normal and breath sounds normal.  Abdominal: Soft. She exhibits no distension. There is no tenderness.  Musculoskeletal: Normal range of motion.  Neurological: She is alert and oriented to person, place, and time.  Skin: Skin is warm and dry.  Psychiatric: She has a normal mood and affect. Her behavior is normal. Judgment and thought content normal.   BP 149/77  Pulse 64  Temp(Src) 96.7 F (35.9 C) (Oral)  Ht  (1.626 m)  Wt 222 lb 3.2 oz (100.789 kg)  BMI 38.12 kg/m2  KUB : Preliminary report showing moderate stool burden throughout colon-Preliminary reading by Paulene Floor, FNP  [facility]  Results for orders placed in visit on 10/10/13  POCT URINALYSIS DIPSTICK      Result Value Ref Range   Color, UA yellow     Clarity, UA cloudy     Glucose, UA neg     Bilirubin, UA neg     Ketones, UA neg     Spec Grav, UA 1.010     Blood, UA trace     pH, UA 7.0     Protein, UA neg     Urobilinogen, UA negative     Nitrite, UA neg     Leukocytes, UA moderate (2+)    POCT UA - MICROSCOPIC ONLY      Result Value Ref Range   WBC, Ur, HPF, POC 15-20     RBC, urine, microscopic 5-10     Bacteria, U Microscopic neg     Mucus, UA neg     Epithelial cells, urine per micros occ     Crystals, Ur, HPF, POC neg     Casts, Ur, LPF, POC neg     Yeast, UA mod    POCT CBC      Result Value Ref Range   WBC 4.5 (*) 4.6 - 10.2 K/uL   Lymph, poc 1.1  0.6 - 3.4   POC LYMPH PERCENT 24.1  10 - 50 %L   POC Granulocyte 3.2  2 - 6.9   Granulocyte percent 71.1  37 - 80 %G   RBC 5.0  4.04 - 5.48 M/uL   Hemoglobin 13.9  12.2 - 16.2 g/dL   HCT, POC 86.5  78.4 - 47.9 %   MCV 85.8  80 - 97 fL   MCH, POC 27.6  27 - 31.2 pg   MCHC 32.2  31.8 - 35.4 g/dL   RDW, POC 69.6     Platelet Count, POC 193.0  142 - 424 K/uL   MPV 8.4  0 - 99.8 fL         Assessment &  Plan:   1. Flank pain   2. Abdominal pain, left lower quadrant    Miralax daily with juice Increase fluid intake Follow up as needed.  Mary-Margaret Daphine Deutscher, FNP

## 2014-03-20 DIAGNOSIS — K219 Gastro-esophageal reflux disease without esophagitis: Secondary | ICD-10-CM | POA: Insufficient documentation

## 2014-06-05 ENCOUNTER — Encounter: Payer: Self-pay | Admitting: Cardiovascular Disease

## 2014-06-05 ENCOUNTER — Ambulatory Visit (INDEPENDENT_AMBULATORY_CARE_PROVIDER_SITE_OTHER): Payer: Medicare HMO | Admitting: Cardiovascular Disease

## 2014-06-05 VITALS — BP 132/60 | HR 53 | Ht 64.0 in | Wt 218.8 lb

## 2014-06-05 DIAGNOSIS — I340 Nonrheumatic mitral (valve) insufficiency: Secondary | ICD-10-CM

## 2014-06-05 DIAGNOSIS — R079 Chest pain, unspecified: Secondary | ICD-10-CM

## 2014-06-05 DIAGNOSIS — Z136 Encounter for screening for cardiovascular disorders: Secondary | ICD-10-CM | POA: Diagnosis not present

## 2014-06-05 DIAGNOSIS — K219 Gastro-esophageal reflux disease without esophagitis: Secondary | ICD-10-CM | POA: Diagnosis not present

## 2014-06-05 NOTE — Patient Instructions (Signed)
Your physician recommends that you schedule a follow-up appointment in: as needed     Your physician recommends that you continue on your current medications as directed. Please refer to the Current Medication list given to you today.   Thank you for choosing [facility] !         

## 2014-06-05 NOTE — Progress Notes (Signed)
Patient ID: Jodi MelenaGeraldine M Wachsmuth, female   DOB: 1941-01-12, 74 y.o.   MRN: [id number]      SUBJECTIVE: The patient is here to follow-up for chest pain and mitral regurgitation. Echocardiography in 05/2013 demonstrated normal left ventricular systolic function, EF 55-60%, normal chamber size and wall thickness, grade 1 diastolic dysfunction, and mild to moderate mitral regurgitation. Stress echocardiography did not demonstrate any evidence of inducible ischemia. She was subsequently started on omeprazole.  ECG performed in the office today demonstrates normal sinus rhythm with a nonspecific intraventricular conduction delay, QRS duration 104 ms.  She very seldom has intermittent chest pains which she relates to specific food intake. She denies shortness of breath, palpitations, lightheadedness, dizziness, orthopnea, and leg swelling.   Review of Systems: As per "subjective", otherwise negative.  No Known Allergies  Current Outpatient Prescriptions  Medication Sig Dispense Refill  . omeprazole (PRILOSEC) 20 MG capsule Take 20 mg by mouth as needed.     No current facility-administered medications for this visit.    Past Medical History  Diagnosis Date  . Osteopenia   . Osteoporosis     Past Surgical History  Procedure Laterality Date  . Abdominal hysterectomy      History   Social History  . Marital Status: Married    Spouse Name: N/A  . Number of Children: N/A  . Years of Education: N/A   Occupational History  . Not on file.   Social History Main Topics  . Smoking status: Never Smoker   . Smokeless tobacco: Not on file  . Alcohol Use: No  . Drug Use: No  . Sexual Activity: Not on file   Other Topics Concern  . Not on file   Social History Narrative     Filed Vitals:   06/05/14 1053  BP: 132/60  Pulse: 53  Height: 5\' 4"  (1.626 m)  Weight: 218 lb 12.8 oz (99.247 kg)  SpO2: 99%    PHYSICAL EXAM General: NAD HEENT: Normal. Neck: No JVD, no  thyromegaly. Lungs: Clear to auscultation bilaterally with normal respiratory effort. CV: Nondisplaced PMI.  Regular rate and rhythm, normal S1/S2, no S3/S4, no murmur. No pretibial or periankle edema.  No carotid bruit.  Normal pedal pulses.  Abdomen: Soft, nontender, obese, no distention.  Neurologic: Alert and oriented x 3.  Psych: Normal affect. Skin: Normal. Musculoskeletal: Normal range of motion, no gross deformities. Extremities: No clubbing or cyanosis.   ECG: Most recent ECG reviewed.      ASSESSMENT AND PLAN: 1. Chest pain: Symptomatically stable, and likely related to GERD as it has been relieved with dietary modification and the institution of omeprazole. Normal stress testing as noted above.  2. Elevated blood pressure: Currently normal. No treatment required.  3. Mild to moderate mitral regurgitation: She is symptomatically stable. No murmur appreciated today.This can be monitored by her PCP clinically.  Dispo: f/u prn.   Prentice DockerSuresh Velma Agnes, M.D., F.A.C.C.

## 2014-07-30 ENCOUNTER — Encounter: Payer: Commercial Managed Care - HMO | Admitting: Family Medicine

## 2014-08-28 ENCOUNTER — Encounter: Payer: Self-pay | Admitting: Physician Assistant

## 2014-08-28 ENCOUNTER — Ambulatory Visit (INDEPENDENT_AMBULATORY_CARE_PROVIDER_SITE_OTHER): Payer: Medicare HMO | Admitting: Physician Assistant

## 2014-08-28 VITALS — BP 158/88 | HR 69 | Temp 97.1°F | Ht 64.0 in | Wt 218.0 lb

## 2014-08-28 DIAGNOSIS — N3000 Acute cystitis without hematuria: Secondary | ICD-10-CM

## 2014-08-28 DIAGNOSIS — R350 Frequency of micturition: Secondary | ICD-10-CM

## 2014-08-28 DIAGNOSIS — R1084 Generalized abdominal pain: Secondary | ICD-10-CM | POA: Diagnosis not present

## 2014-08-28 LAB — POCT UA - MICROSCOPIC ONLY
Bacteria, U Microscopic: NEGATIVE
CRYSTALS, UR, HPF, POC: NEGATIVE
Casts, Ur, LPF, POC: NEGATIVE
Mucus, UA: NEGATIVE
RBC, urine, microscopic: NEGATIVE
YEAST UA: NEGATIVE

## 2014-08-28 LAB — POCT URINALYSIS DIPSTICK
BILIRUBIN UA: NEGATIVE
Glucose, UA: NEGATIVE
NITRITE UA: NEGATIVE
PH UA: 7
Protein, UA: NEGATIVE
RBC UA: NEGATIVE
Spec Grav, UA: 1.015
Urobilinogen, UA: NEGATIVE

## 2014-08-28 MED ORDER — CIPROFLOXACIN HCL 500 MG PO TABS: 500.0000 mg | ORAL_TABLET | Freq: Two times a day (BID) | ORAL | Status: DC

## 2014-08-28 NOTE — Progress Notes (Signed)
   Subjective:    Patient ID: Jodi Allen, female    DOB: 23-Oct-1940, 74 y.o.   MRN: [id number]  HPI 74 y/o female presents with c/o left flank pain last night. She is not having any pain today. She states that the pain started suddenly last night while she was eating spicy food. Episode lasted overnight and was present this morning but went away. She states that it woke her up once during the night. She has had episodes of pain similar in the past when she eats spicy or greasy foods. Took an acid reflux medication last night with relief.    Review of Systems  Constitutional: Negative.   HENT: Negative.   Eyes: Negative.   Respiratory: Negative.   Cardiovascular: Negative.   Gastrointestinal: Positive for nausea (occasional after eating spicy foods ) and abdominal pain (left sided ). Negative for vomiting, diarrhea and constipation.  Endocrine: Negative.  Negative for polyuria.  Genitourinary: Positive for urgency and frequency. Negative for dysuria and hematuria.  Musculoskeletal: Negative for back pain.  Neurological: Negative.   Psychiatric/Behavioral: Negative.        Objective:   Physical Exam  Constitutional: She is oriented to person, place, and time. She appears well-developed and well-nourished. No distress.  Abdominal: Soft. There is tenderness (mild LLQ  and suprapubic ttp ).  Mild CVA ttp left side   Neurological: She is alert and oriented to person, place, and time.  Skin: She is not diaphoretic.  Psychiatric: She has a normal mood and affect. Her behavior is normal. Judgment and thought content normal.  Nursing note and vitals reviewed.         Assessment & Plan:  1. Urinary frequency  - POCT urinalysis dipstick - POCT UA - Microscopic Only - ciprofloxacin (CIPRO) 500 MG tablet; Take 1 tablet (500 mg total) by mouth 2 (two) times daily.  Dispense: 20 tablet; Refill: 0  2. Generalized abdominal pain  - POCT urinalysis dipstick - POCT UA - Microscopic  Only - ciprofloxacin (CIPRO) 500 MG tablet; Take 1 tablet (500 mg total) by mouth 2 (two) times daily.  Dispense: 20 tablet; Refill: 0  3. Acute cystitis without hematuria  - ciprofloxacin (CIPRO) 500 MG tablet; Take 1 tablet (500 mg total) by mouth 2 (two) times daily.  Dispense: 20 tablet; Refill: 0   Continue all meds Labs pending Health Maintenance reviewed Diet and exercise encouraged RTO 2 weeks   Dequane Strahan A. Chauncey Reading PA-C

## 2014-08-28 NOTE — Addendum Note (Signed)
Addended by: Prescott Gum on: 08/28/2014 05:25 PM   Modules accepted: Orders

## 2014-08-29 LAB — URINE CULTURE

## 2014-10-01 DIAGNOSIS — Z1231 Encounter for screening mammogram for malignant neoplasm of breast: Secondary | ICD-10-CM | POA: Diagnosis not present

## 2014-10-25 ENCOUNTER — Encounter: Payer: Self-pay | Admitting: Family Medicine

## 2014-11-28 ENCOUNTER — Ambulatory Visit: Payer: Medicare HMO | Admitting: Pediatrics

## 2015-02-01 ENCOUNTER — Ambulatory Visit (INDEPENDENT_AMBULATORY_CARE_PROVIDER_SITE_OTHER): Payer: Commercial Managed Care - HMO | Admitting: Pediatrics

## 2015-02-01 VITALS — BP 168/84 | HR 78 | Temp 98.1°F | Ht 64.0 in | Wt 221.2 lb

## 2015-02-01 DIAGNOSIS — J069 Acute upper respiratory infection, unspecified: Secondary | ICD-10-CM | POA: Diagnosis not present

## 2015-02-01 NOTE — Patient Instructions (Signed)
Take tylenol (acetaminophen) Use salt nasal sprays throughout the day to help with sinus congestion Call me if feeling worse on Tuesday

## 2015-02-01 NOTE — Progress Notes (Signed)
    Subjective:    Patient ID: Jodi Allen, female    DOB: January 13, 1941, 74 y.o.   MRN: [id number]  CC: Hoarse; Cough; and Nasal Congestion   HPI: Jodi MelenaGeraldine M Westerlund is a 74 y.o. female presenting for Hoarse; Cough; and Nasal Congestion  Sick for past 4-5 days Taking OTC meds, not helping Lots of congestion Not able to rest at night A little cough No fevers Appetite has been normal No abd pain No facial tenderness or pain   [facility] 10/10/2013 10/17/2012  Decreased Interest 0 0  Down, Depressed, Hopeless 0 0  PHQ - 2 Score 0 0     Relevant past medical, surgical, family and social history reviewed and updated as indicated. Interim medical history since our last visit reviewed. Allergies and medications reviewed and updated.    ROS: Per HPI unless specifically indicated above  History  Smoking status  . Never Smoker   Smokeless tobacco  . Not on file    Past Medical History Patient Active Problem List   Diagnosis Date Noted  . Chest pain 05/05/2013  . Abdominal pain, epigastric 04/19/2013  . Abdominal pain, left lower quadrant 10/20/2012  . Osteopenia     Current Outpatient Prescriptions  Medication Sig Dispense Refill  . omeprazole (PRILOSEC) 20 MG capsule Take 20 mg by mouth as needed.     No current facility-administered medications for this visit.       Objective:    BP 168/84 mmHg  Pulse 78  Temp(Src) 98.1 F (36.7 C) (Oral)  Ht 5\' 4"  (1.626 m)  Wt 221 lb 3.2 oz (100.336 kg)  BMI 37.95 kg/m2  Wt Readings from Last 3 Encounters:  02/01/15 221 lb 3.2 oz (100.336 kg)  08/28/14 218 lb (98.884 kg)  06/05/14 218 lb 12.8 oz (99.247 kg)    Gen: NAD, alert, cooperative with exam, NCAT, congested EYES: EOMI, no scleral injection or icterus ENT:  TMs pearly gray b/l, OP with mild erythema, tonsils not visible LYMPH: no cervical LAD CV: NRRR, normal S1/S2, no murmur, distal pulses 2+ b/l Resp: CTABL, no wheezes, normal WOB Abd:  +BS, soft, NTND. no guarding or organomegaly Ext: No edema, warm Neuro: Alert and oriented     Assessment & Plan:    [name] was seen today for hoarse, cough and nasal congestion, likely due to acute viral URI. Discussed symptomatic care including sinus rinses. Viruses often last up to 5-7 days. If symptoms worsening or not improving after then call to let me know. Cough may continue for a couple of weeks.  Diagnoses and all orders for this visit:  Acute URI     Follow up plan: Return in about 3 weeks (around 02/22/2015) for CPE, med follow up, BP follow up.  Rex Krasarol Vincent, MD [facility] 02/01/2015, 5:45 PM

## 2015-02-21 ENCOUNTER — Encounter: Payer: Self-pay | Admitting: Pediatrics

## 2015-02-21 ENCOUNTER — Ambulatory Visit (INDEPENDENT_AMBULATORY_CARE_PROVIDER_SITE_OTHER): Payer: Commercial Managed Care - HMO

## 2015-02-21 ENCOUNTER — Ambulatory Visit (INDEPENDENT_AMBULATORY_CARE_PROVIDER_SITE_OTHER): Payer: Commercial Managed Care - HMO | Admitting: Pediatrics

## 2015-02-21 VITALS — BP 153/77 | HR 68 | Temp 96.8°F | Ht 64.0 in | Wt 218.6 lb

## 2015-02-21 DIAGNOSIS — R030 Elevated blood-pressure reading, without diagnosis of hypertension: Secondary | ICD-10-CM

## 2015-02-21 DIAGNOSIS — E785 Hyperlipidemia, unspecified: Secondary | ICD-10-CM

## 2015-02-21 DIAGNOSIS — N390 Urinary tract infection, site not specified: Secondary | ICD-10-CM | POA: Diagnosis not present

## 2015-02-21 DIAGNOSIS — R1032 Left lower quadrant pain: Secondary | ICD-10-CM

## 2015-02-21 DIAGNOSIS — Z6837 Body mass index (BMI) 37.0-37.9, adult: Secondary | ICD-10-CM | POA: Insufficient documentation

## 2015-02-21 DIAGNOSIS — M8580 Other specified disorders of bone density and structure, unspecified site: Secondary | ICD-10-CM

## 2015-02-21 DIAGNOSIS — M899 Disorder of bone, unspecified: Secondary | ICD-10-CM | POA: Diagnosis not present

## 2015-02-21 DIAGNOSIS — N3000 Acute cystitis without hematuria: Secondary | ICD-10-CM | POA: Diagnosis not present

## 2015-02-21 DIAGNOSIS — R8281 Pyuria: Secondary | ICD-10-CM

## 2015-02-21 DIAGNOSIS — R739 Hyperglycemia, unspecified: Secondary | ICD-10-CM | POA: Diagnosis not present

## 2015-02-21 DIAGNOSIS — IMO0001 Reserved for inherently not codable concepts without codable children: Secondary | ICD-10-CM

## 2015-02-21 LAB — POCT URINALYSIS DIPSTICK
BILIRUBIN UA: NEGATIVE
Blood, UA: NEGATIVE
GLUCOSE UA: NEGATIVE
Ketones, UA: NEGATIVE
NITRITE UA: NEGATIVE
PH UA: 8
Protein, UA: NEGATIVE
Spec Grav, UA: <=1.005
Urobilinogen, UA: NEGATIVE

## 2015-02-21 LAB — POCT UA - MICROSCOPIC ONLY
Bacteria, U Microscopic: NEGATIVE
Casts, Ur, LPF, POC: NEGATIVE
Crystals, Ur, HPF, POC: NEGATIVE
Mucus, UA: NEGATIVE
RBC, urine, microscopic: NEGATIVE
YEAST UA: NEGATIVE

## 2015-02-21 LAB — POCT GLYCOSYLATED HEMOGLOBIN (HGB A1C): HEMOGLOBIN A1C: 5.7

## 2015-02-21 NOTE — Progress Notes (Signed)
Subjective:    Patient ID: Jodi Allen, female    DOB: Oct 13, 1940, 75 y.o.   MRN: [id number]  CC: Follow-up multiple med problems and left groin pain   HPI: Jodi Allen is a 75 y.o. female presenting for Follow-up and left groin pain  L groin pain hurting 5 days ago so made this appointment. No pain since then Having regularly bowel movements Appetite has been good No nausea or vomiting Just groin pain Was going to exercise class, sometimes pain gets worse with exercise No history of kidney stones No dysuria   BPs: has a cuff at home, not sure if they were elevated or not, can't remember   No headaches, vision changes, SOB or CP  Depression screen [facility] 2/9 02/21/2015 10/10/2013 10/17/2012  Decreased Interest 0 0 0  Down, Depressed, Hopeless 0 0 0  PHQ - 2 Score 0 0 0     Relevant past medical, surgical, family and social history reviewed and updated as indicated. Interim medical history since our last visit reviewed. Allergies and medications reviewed and updated.    ROS: Per HPI unless specifically indicated above  History  Smoking status  . Never Smoker   Smokeless tobacco  . Not on file    Past Medical History Patient Active Problem List   Diagnosis Date Noted  . BMI 37.0-37.9, adult 02/21/2015  . Hyperglycemia 02/21/2015  . Elevated blood pressure 02/21/2015  . Hyperlipidemia 02/21/2015  . Chest pain 05/05/2013  . Abdominal pain, epigastric 04/19/2013  . Abdominal pain, left lower quadrant 10/20/2012  . Osteopenia     Current Outpatient Prescriptions  Medication Sig Dispense Refill  . omeprazole (PRILOSEC) 20 MG capsule Take 20 mg by mouth as needed.     No current facility-administered medications for this visit.       Objective:    BP 153/77 mmHg  Pulse 68  Temp(Src) 96.8 F (36 C) (Oral)  Ht 5' 4"  (1.626 m)  Wt 218 lb 9.6 oz (99.156 kg)  BMI 37.50 kg/m2  Wt Readings from Last 3 Encounters:  02/21/15 218 lb 9.6 oz (99.156  kg)  02/01/15 221 lb 3.2 oz (100.336 kg)  08/28/14 218 lb (98.884 kg)     Gen: NAD, alert, cooperative with exam, NCAT EYES: EOMI, no scleral injection or icterus ENT:  OP without erythema LYMPH: no cervical LAD CV: NRRR, normal S1/S2, no murmur, distal pulses 2+ b/l Resp: CTABL, no wheezes, normal WOB Abd: +BS, soft, NTND. no guarding or organomegaly Ext: No edema, warm Neuro: Alert and oriented, strength equal b/l UE and LE, coordination grossly normal MSK: normal ROM internal and ext rotation of hips b/l, pain with int and ext rotation of L hip     Assessment & Plan:    Jodi Allen was seen today for follow-up multiple med problems and left groin pain.   Diagnoses and all orders for this visit:  Elevated blood pressure Pt has been hesitant to start medication. Likely needs to. Check BPs at home, bring to appt in 2 weeks for recheck. Will check labs. -     CMP14+EGFR  Hyperglycemia -     POCT glycosylated hemoglobin (Hb A1C)  Osteopenia -     DG Bone Density; Future  Groin pain, left Some pain with ROM L hip, ROM not decreased. UA with +leukocytes, will send for culture. Possible that OA is contributing. -     CBC -     POCT UA - Microscopic Only -  POCT UA - Microscopic Only -     DG HIP UNILAT W OR W/O PELVIS 2-3 VIEWS LEFT; Future  Hyperlipidemia -     Lipid panel  BMI 37.0-37.9, adult Discussed lifestyle changes, increased exercise, decreased sugary foods.    Follow up plan: Return for 2 weeks for nurse BP recheck, 2 months to see Dr. Evette Doffing.  Assunta Found, MD [facility] 02/21/2015, 8:33 AM

## 2015-02-21 NOTE — Patient Instructions (Signed)
Check your blood pressures at home every day and write down numbers. Come back in 2 weeks for [place] to recheck your blood pressure

## 2015-02-22 LAB — CMP14+EGFR
ALK PHOS: 98 IU/L (ref 39–117)
ALT: 15 IU/L (ref 0–32)
AST: 22 IU/L (ref 0–40)
Albumin/Globulin Ratio: 1.3 (ref 1.1–2.5)
Albumin: 4 g/dL (ref 3.5–4.8)
BUN/Creatinine Ratio: 13 (ref 11–26)
BUN: 12 mg/dL (ref 8–27)
Bilirubin Total: 0.5 mg/dL (ref 0.0–1.2)
CO2: 24 mmol/L (ref 18–29)
Calcium: 9.1 mg/dL (ref 8.7–10.3)
Chloride: 101 mmol/L (ref 96–106)
Creatinine, Ser: 0.89 mg/dL (ref 0.57–1.00)
GFR calc Af Amer: 75 mL/min/{1.73_m2} (ref >59)
GFR calc non Af Amer: 65 mL/min/{1.73_m2} (ref >59)
GLUCOSE: 90 mg/dL (ref 65–99)
Globulin, Total: 3.1 g/dL (ref 1.5–4.5)
Potassium: 4.3 mmol/L (ref 3.5–5.2)
Sodium: 139 mmol/L (ref 134–144)
Total Protein: 7.1 g/dL (ref 6.0–8.5)

## 2015-02-22 LAB — CBC
HEMATOCRIT: 39.4 % (ref 34.0–46.6)
HEMOGLOBIN: 13.3 g/dL (ref 11.1–15.9)
MCH: 28.7 pg (ref 26.6–33.0)
MCHC: 33.8 g/dL (ref 31.5–35.7)
MCV: 85 fL (ref 79–97)
Platelets: 208 10*3/uL (ref 150–379)
RBC: 4.63 x10E6/uL (ref 3.77–5.28)
RDW: 14.2 % (ref 12.3–15.4)
WBC: 4.2 10*3/uL (ref 3.4–10.8)

## 2015-02-22 LAB — LIPID PANEL
CHOL/HDL RATIO: 4.3 ratio (ref 0.0–4.4)
Cholesterol, Total: 193 mg/dL (ref 100–199)
HDL: 45 mg/dL (ref >39)
LDL CALC: 130 mg/dL — AB (ref 0–99)
TRIGLYCERIDES: 90 mg/dL (ref 0–149)
VLDL Cholesterol Cal: 18 mg/dL (ref 5–40)

## 2015-02-23 ENCOUNTER — Encounter: Payer: Self-pay | Admitting: Pediatrics

## 2015-02-25 LAB — URINE CULTURE

## 2015-02-25 MED ORDER — NITROFURANTOIN MONOHYD MACRO 100 MG PO CAPS: 100.0000 mg | ORAL_CAPSULE | Freq: Two times a day (BID) | ORAL | Status: DC

## 2015-02-25 NOTE — Addendum Note (Signed)
Addended by: Johna Sheriff on: 02/25/2015 07:45 AM   Modules accepted: Orders

## 2015-05-08 ENCOUNTER — Encounter: Payer: Self-pay | Admitting: Family Medicine

## 2015-05-08 ENCOUNTER — Ambulatory Visit: Payer: Commercial Managed Care - HMO | Admitting: Family Medicine

## 2015-05-08 ENCOUNTER — Ambulatory Visit (INDEPENDENT_AMBULATORY_CARE_PROVIDER_SITE_OTHER): Payer: Commercial Managed Care - HMO | Admitting: Family Medicine

## 2015-05-08 ENCOUNTER — Encounter (INDEPENDENT_AMBULATORY_CARE_PROVIDER_SITE_OTHER): Payer: Self-pay

## 2015-05-08 VITALS — BP 165/77 | HR 80 | Temp 96.8°F | Ht 64.0 in | Wt 219.0 lb

## 2015-05-08 DIAGNOSIS — I10 Essential (primary) hypertension: Secondary | ICD-10-CM | POA: Diagnosis not present

## 2015-05-08 DIAGNOSIS — J309 Allergic rhinitis, unspecified: Secondary | ICD-10-CM | POA: Diagnosis not present

## 2015-05-08 MED ORDER — LORATADINE 10 MG PO TABS: 10.0000 mg | ORAL_TABLET | Freq: Every day | ORAL | Status: DC

## 2015-05-08 MED ORDER — FLUTICASONE PROPIONATE 50 MCG/ACT NA SUSP: 1.0000 | Freq: Two times a day (BID) | NASAL | Status: DC | PRN

## 2015-05-08 MED ORDER — HYDROCHLOROTHIAZIDE 12.5 MG PO TABS: 12.5000 mg | ORAL_TABLET | Freq: Every day | ORAL | Status: DC

## 2015-05-08 NOTE — Progress Notes (Signed)
BP 165/77 mmHg  Pulse 80  Temp(Src) 96.8 F (36 C) (Oral)  Ht 5' 4"  (1.626 m)  Wt 219 lb (99.338 kg)  BMI 37.57 kg/m2   Subjective:    Patient ID: Jodi Allen, female    DOB: Nov 13, 1940, 75 y.o.   MRN: [id number]  HPI: Jodi Allen is a 75 y.o. female presenting on 05/08/2015 for Itching all over and Sinusitis   HPI Hypertension Patient's blood pressure has been consistently elevated over the last 3 visits that she has had. She is starting to get concerned because she had a friend in her 38s get a stroke because of elevated blood pressure that was uncontrolled. She has consistently been in the 165/77 range. She is ready to try a medication but would like it to be low dose.  Sinus congestion and dizziness and itching Patient has been having sinus congestion and dizziness especially when she leans forward or stands up quickly. She feels lightheaded when she leans forward or stands up quickly. She denies any fevers or chills. She has been having some sneezing and postnasal drainage and a cough that is worse at night and is nonproductive. She has not been using anything for allergies or sinus congestion to this point yet. She denies any shortness of breath or wheezing. She denies any ear pressure.  Relevant past medical, surgical, family and social history reviewed and updated as indicated. Interim medical history since our last visit reviewed. Allergies and medications reviewed and updated.  Review of Systems  Constitutional: Negative for fever and chills.  HENT: Positive for congestion, postnasal drip, rhinorrhea, sinus pressure, sneezing and sore throat. Negative for ear discharge and ear pain.   Eyes: Negative for pain, redness and visual disturbance.  Respiratory: Positive for cough. Negative for chest tightness and shortness of breath.   Cardiovascular: Negative for chest pain and leg swelling.  Genitourinary: Negative for dysuria and difficulty urinating.    Musculoskeletal: Negative for back pain and gait problem.  Skin: Negative for rash.  Neurological: Positive for dizziness. Negative for seizures, facial asymmetry, weakness, light-headedness and headaches.  Psychiatric/Behavioral: Negative for behavioral problems and agitation.  All other systems reviewed and are negative.   Per HPI unless specifically indicated above     Medication List       This list is accurate as of: 05/08/15  4:02 PM.  Always use your most recent med list.               fluticasone 50 MCG/ACT nasal spray  Commonly known as:  FLONASE  Place 1 spray into both nostrils 2 (two) times daily as needed for allergies or rhinitis.     hydrochlorothiazide 12.5 MG tablet  Commonly known as:  HYDRODIURIL  Take 1 tablet (12.5 mg total) by mouth daily.     loratadine 10 MG tablet  Commonly known as:  CLARITIN  Take 1 tablet (10 mg total) by mouth daily.           Objective:    BP 165/77 mmHg  Pulse 80  Temp(Src) 96.8 F (36 C) (Oral)  Ht 5' 4"  (1.626 m)  Wt 219 lb (99.338 kg)  BMI 37.57 kg/m2  Wt Readings from Last 3 Encounters:  05/08/15 219 lb (99.338 kg)  02/21/15 218 lb 9.6 oz (99.156 kg)  02/01/15 221 lb 3.2 oz (100.336 kg)    Physical Exam  Constitutional: She is oriented to person, place, and time. She appears well-developed and well-nourished. No distress.  HENT:  Right Ear: Tympanic membrane, external ear and ear canal normal.  Left Ear: Tympanic membrane, external ear and ear canal normal.  Nose: Mucosal edema and rhinorrhea present. No epistaxis. Right sinus exhibits no maxillary sinus tenderness and no frontal sinus tenderness. Left sinus exhibits no maxillary sinus tenderness and no frontal sinus tenderness.  Mouth/Throat: Uvula is midline and mucous membranes are normal. Posterior oropharyngeal edema and posterior oropharyngeal erythema present. No oropharyngeal exudate or tonsillar abscesses.  Eyes: Conjunctivae and EOM are normal.   Neck: Neck supple. No thyromegaly present.  Cardiovascular: Normal rate, regular rhythm, normal heart sounds and intact distal pulses.   No murmur heard. Pulmonary/Chest: Effort normal and breath sounds normal. No respiratory distress. She has no wheezes.  Musculoskeletal: Normal range of motion. She exhibits no edema or tenderness.  Lymphadenopathy:    She has no cervical adenopathy.  Neurological: She is alert and oriented to person, place, and time. Coordination normal.  Skin: Skin is warm and dry. No rash noted. She is not diaphoretic.  Psychiatric: She has a normal mood and affect. Her behavior is normal.  Nursing note and vitals reviewed.   Results for orders placed or performed in visit on 02/21/15  Urine culture  Result Value Ref Range   Urine Culture, Routine Final report (A)    Urine Culture result 1 Escherichia coli (A)    ANTIMICROBIAL SUSCEPTIBILITY Comment   CMP14+EGFR  Result Value Ref Range   Glucose 90 65 - 99 mg/dL   BUN 12 8 - 27 mg/dL   Creatinine, Ser 0.89 0.57 - 1.00 mg/dL   GFR calc non Af Amer 65 >59 mL/min/1.73   GFR calc Af Amer 75 >59 mL/min/1.73   BUN/Creatinine Ratio 13 11 - 26   Sodium 139 134 - 144 mmol/L   Potassium 4.3 3.5 - 5.2 mmol/L   Chloride 101 96 - 106 mmol/L   CO2 24 18 - 29 mmol/L   Calcium 9.1 8.7 - 10.3 mg/dL   Total Protein 7.1 6.0 - 8.5 g/dL   Albumin 4.0 3.5 - 4.8 g/dL   Globulin, Total 3.1 1.5 - 4.5 g/dL   Albumin/Globulin Ratio 1.3 1.1 - 2.5   Bilirubin Total 0.5 0.0 - 1.2 mg/dL   Alkaline Phosphatase 98 39 - 117 IU/L   AST 22 0 - 40 IU/L   ALT 15 0 - 32 IU/L  CBC  Result Value Ref Range   WBC 4.2 3.4 - 10.8 x10E3/uL   RBC 4.63 3.77 - 5.28 x10E6/uL   Hemoglobin 13.3 11.1 - 15.9 g/dL   Hematocrit 39.4 34.0 - 46.6 %   MCV 85 79 - 97 fL   MCH 28.7 26.6 - 33.0 pg   MCHC 33.8 31.5 - 35.7 g/dL   RDW 14.2 12.3 - 15.4 %   Platelets 208 150 - 379 x10E3/uL  Lipid panel  Result Value Ref Range   Cholesterol, Total 193 100 -  199 mg/dL   Triglycerides 90 0 - 149 mg/dL   HDL 45 >39 mg/dL   VLDL Cholesterol Cal 18 5 - 40 mg/dL   LDL Calculated 130 (H) 0 - 99 mg/dL   Chol/HDL Ratio 4.3 0.0 - 4.4 ratio units  POCT glycosylated hemoglobin (Hb A1C)  Result Value Ref Range   Hemoglobin A1C 5.7   POCT UA - Microscopic Only  Result Value Ref Range   WBC, Ur, HPF, POC 1-5    RBC, urine, microscopic negative    Bacteria, U Microscopic negative    Mucus,  UA negative    Epithelial cells, urine per micros many    Crystals, Ur, HPF, POC negative    Casts, Ur, LPF, POC negative    Yeast, UA negative   POCT urinalysis dipstick  Result Value Ref Range   Color, UA gold    Clarity, UA clear    Glucose, UA negative    Bilirubin, UA negative    Ketones, UA negative    Spec Grav, UA <=1.005    Blood, UA negaitve    pH, UA 8.0    Protein, UA negative    Urobilinogen, UA negative    Nitrite, UA negative    Leukocytes, UA large (3+) (A) Negative      Assessment & Plan:   Problem List Items Addressed This Visit    None    Visit Diagnoses    Essential hypertension, benign    -  Primary    Relevant Medications    hydrochlorothiazide (HYDRODIURIL) 12.5 MG tablet    Allergic rhinitis, unspecified allergic rhinitis type        Relevant Medications    fluticasone (FLONASE) 50 MCG/ACT nasal spray    loratadine (CLARITIN) 10 MG tablet       Follow up plan: Return in about 4 weeks (around 06/05/2015), or if symptoms worsen or fail to improve, for Hypertension recheck.  Counseling provided for all of the vaccine components No orders of the defined types were placed in this encounter.    Caryl Pina, MD [facility] 05/08/2015, 4:02 PM

## 2015-05-22 ENCOUNTER — Other Ambulatory Visit: Payer: Self-pay | Admitting: Pediatrics

## 2015-05-23 NOTE — Telephone Encounter (Signed)
Last seen 05/08/15  Dr Dettinger

## 2015-11-26 ENCOUNTER — Encounter: Payer: Commercial Managed Care - HMO | Admitting: *Deleted

## 2016-03-02 ENCOUNTER — Encounter: Payer: Self-pay | Admitting: Pediatrics

## 2016-03-02 ENCOUNTER — Ambulatory Visit (INDEPENDENT_AMBULATORY_CARE_PROVIDER_SITE_OTHER): Payer: Medicare HMO | Admitting: Pediatrics

## 2016-03-02 VITALS — BP 137/83 | HR 76 | Temp 97.0°F | Ht 64.0 in | Wt 221.8 lb

## 2016-03-02 DIAGNOSIS — Z6837 Body mass index (BMI) 37.0-37.9, adult: Secondary | ICD-10-CM | POA: Diagnosis not present

## 2016-03-02 DIAGNOSIS — E785 Hyperlipidemia, unspecified: Secondary | ICD-10-CM

## 2016-03-02 DIAGNOSIS — I10 Essential (primary) hypertension: Secondary | ICD-10-CM

## 2016-03-02 DIAGNOSIS — K219 Gastro-esophageal reflux disease without esophagitis: Secondary | ICD-10-CM | POA: Diagnosis not present

## 2016-03-02 DIAGNOSIS — M25552 Pain in left hip: Secondary | ICD-10-CM

## 2016-03-02 LAB — LIPID PANEL
CHOL/HDL RATIO: 5 ratio — AB (ref 0.0–4.4)
Cholesterol, Total: 195 mg/dL (ref 100–199)
HDL: 39 mg/dL — AB (ref >39)
LDL Calculated: 126 mg/dL — ABNORMAL HIGH (ref 0–99)
Triglycerides: 148 mg/dL (ref 0–149)
VLDL Cholesterol Cal: 30 mg/dL (ref 5–40)

## 2016-03-02 LAB — BMP8+EGFR
BUN / CREAT RATIO: 11 — AB (ref 12–28)
BUN: 10 mg/dL (ref 8–27)
CALCIUM: 9.2 mg/dL (ref 8.7–10.3)
CHLORIDE: 99 mmol/L (ref 96–106)
CO2: 25 mmol/L (ref 18–29)
Creatinine, Ser: 0.92 mg/dL (ref 0.57–1.00)
GFR, EST AFRICAN AMERICAN: 71 mL/min/{1.73_m2} (ref >59)
GFR, EST NON AFRICAN AMERICAN: 62 mL/min/{1.73_m2} (ref >59)
Glucose: 100 mg/dL — ABNORMAL HIGH (ref 65–99)
POTASSIUM: 4.7 mmol/L (ref 3.5–5.2)
Sodium: 139 mmol/L (ref 134–144)

## 2016-03-02 MED ORDER — HYDROCHLOROTHIAZIDE 12.5 MG PO TABS: 12.5000 mg | ORAL_TABLET | Freq: Every day | ORAL | 1 refills | Status: DC

## 2016-03-02 MED ORDER — ATORVASTATIN CALCIUM 40 MG PO TABS: 40.0000 mg | ORAL_TABLET | Freq: Every day | ORAL | 1 refills | Status: DC

## 2016-03-02 MED ORDER — RANITIDINE HCL 150 MG PO TABS: 150.0000 mg | ORAL_TABLET | Freq: Two times a day (BID) | ORAL | 3 refills | Status: DC

## 2016-03-02 NOTE — Patient Instructions (Addendum)
Food Choices for Gastroesophageal Reflux Disease, Adult When you have gastroesophageal reflux disease (GERD), the foods you eat and your eating habits are very important. Choosing the right foods can help ease your discomfort. What guidelines do I need to follow?  Choose fruits, vegetables, whole grains, and low-fat dairy products.  Choose low-fat meat, fish, and poultry.  Limit fats such as oils, salad dressings, butter, nuts, and avocado.  Keep a food diary. This helps you identify foods that cause symptoms.  Avoid foods that cause symptoms. These may be different for everyone.  Eat small meals often instead of 3 large meals a day.  Eat your meals slowly, in a place where you are relaxed.  Limit fried foods.  Cook foods using methods other than frying.  Avoid drinking alcohol.  Avoid drinking large amounts of liquids with your meals.  Avoid bending over or lying down until 2-3 hours after eating. What foods are not recommended? These are some foods and drinks that may make your symptoms worse: Vegetables  Tomatoes. Tomato juice. Tomato and spaghetti sauce. Chili peppers. Onion and garlic. Horseradish. Fruits  Oranges, grapefruit, and lemon (fruit and juice). Meats  High-fat meats, fish, and poultry. This includes hot dogs, ribs, ham, sausage, salami, and bacon. Dairy  Whole milk and chocolate milk. Sour cream. Cream. Butter. Ice cream. Cream cheese. Drinks  Coffee and tea. Bubbly (carbonated) drinks or energy drinks. Condiments  Hot sauce. Barbecue sauce. Sweets/Desserts  Chocolate and cocoa. Donuts. Peppermint and spearmint. Fats and Oils  High-fat foods. This includes French fries and potato chips. Other  Vinegar. Strong spices. This includes black pepper, white pepper, red pepper, cayenne, curry powder, cloves, ginger, and chili powder. The items listed above may not be a complete list of foods and drinks to avoid. Contact your dietitian for more information.    This information is not intended to replace advice given to you by your health care provider. Make sure you discuss any questions you have with your health care provider. Document Released: 07/21/2011 Document Revised: 06/27/2015 Document Reviewed: 11/23/2012 Elsevier Interactive Patient Education  2017 [employer] Inc.  

## 2016-03-02 NOTE — Progress Notes (Signed)
  Subjective:   Patient ID: Jodi Allen, female    DOB: August 22, 1940, 76 y.o.   MRN: [id number] CC: Follow-up (3 month) and Groin Pain (Intermittent)  HPI: Jodi Allen is a 76 y.o. female presenting for Follow-up (3 month) and Groin Pain (Intermittent)  Has burning in chest after eating certain foods such as spicy foods, hamburgers Not taking any meds now for it Vegetables dont bother it other than cabbage No vomiting, no nausea Regular stools, no blod, no constipation or diarrhea  HTN: No chest pain or SOB Was taking it regularly but ran out several months ago  Has groin pain that comes and goes Sometimes worse after walking Aleve takes pain away  Appetite has been good  Mammogram done in 09/2015 on mobile bus that comes here  Relevant past medical, surgical, family and social history reviewed. Allergies and medications reviewed and updated. History  Smoking Status  . Never Smoker  Smokeless Tobacco  . Never Used   ROS: Per HPI   Objective:    BP (!) 141/79   Pulse 74   Temp 97 F (36.1 C) (Oral)   Ht _0  (1.626 m)   Wt 221 lb 12.8 oz (100.6 kg)   BMI 38.07 kg/m   Wt Readings from Last 3 Encounters:  03/02/16 221 lb 12.8 oz (100.6 kg)  05/08/15 219 lb (99.3 kg)  02/21/15 218 lb 9.6 oz (99.2 kg)    Gen: NAD, alert, cooperative with exam, NCAT EYES: EOMI, no conjunctival injection, or no icterus ENT:  Nl R TM, L TM obscurred by cerumen, OP without erythema LYMPH: no cervical LAD CV: NRRR, normal S1/S2, no murmur, distal pulses 2+ b/l Resp: CTABL, no wheezes, normal WOB Abd: +BS, soft, NTND. no guarding or organomegaly Ext: No edema, warm Neuro: Alert and oriented MSK: pain with internal rotation L hip, fairly nl ROM compared with R hip, no pain with ROM R hip  Assessment & Plan:  Jodi Allen was seen today for follow-up and groin pain.  Diagnoses and all orders for this visit:  Gastroesophageal reflux disease, esophagitis presence not  specified Gave list of foods to avoid, start below -     ranitidine (ZANTAC) 150 MG tablet; Take 1 tablet (150 mg total) by mouth 2 (two) times daily.  Essential hypertension, benign Ran out of meds Restart below, rtc 4 weeks for recheck Labs today -     hydrochlorothiazide (HYDRODIURIL) 12.5 MG tablet; Take 1 tablet (12.5 mg total) by mouth daily. -     BMP8+EGFR  BMI 37.0-37.9, adult Discussed lifestyle changes, goes to yoga twice a week Avoid snack foods  Hyperlipidemia, unspecified hyperlipidemia type ASCVD 66yrrisk 15.7%, start atorvastatin, recheck lipid panel -     atorvastatin (LIPITOR) 40 MG tablet; Take 1 tablet (40 mg total) by mouth daily. -     Lipid panel  Pain of left hip joint Aleve takes pain away OK to take as needed Rechecking Cr today Let me know if worsening  Follow up plan: Return in about 4 weeks (around 03/30/2016) for BP follow up.  Needs prevnar next visit, doesn't want two shots today CA[place]unta Found MD [place]

## 2016-03-10 ENCOUNTER — Ambulatory Visit (INDEPENDENT_AMBULATORY_CARE_PROVIDER_SITE_OTHER): Payer: Medicare HMO | Admitting: *Deleted

## 2016-03-10 DIAGNOSIS — Z23 Encounter for immunization: Secondary | ICD-10-CM | POA: Diagnosis not present

## 2016-07-15 DIAGNOSIS — I10 Essential (primary) hypertension: Secondary | ICD-10-CM | POA: Diagnosis not present

## 2016-07-15 DIAGNOSIS — H5210 Myopia, unspecified eye: Secondary | ICD-10-CM | POA: Diagnosis not present

## 2016-07-22 ENCOUNTER — Ambulatory Visit (INDEPENDENT_AMBULATORY_CARE_PROVIDER_SITE_OTHER): Payer: Medicare HMO

## 2016-07-22 ENCOUNTER — Ambulatory Visit (INDEPENDENT_AMBULATORY_CARE_PROVIDER_SITE_OTHER): Payer: Medicare HMO | Admitting: Pediatrics

## 2016-07-22 ENCOUNTER — Encounter: Payer: Self-pay | Admitting: Pediatrics

## 2016-07-22 VITALS — BP 139/74 | HR 68 | Temp 96.8°F | Ht 64.0 in | Wt 217.6 lb

## 2016-07-22 DIAGNOSIS — R739 Hyperglycemia, unspecified: Secondary | ICD-10-CM | POA: Diagnosis not present

## 2016-07-22 DIAGNOSIS — G8929 Other chronic pain: Secondary | ICD-10-CM | POA: Diagnosis not present

## 2016-07-22 DIAGNOSIS — I10 Essential (primary) hypertension: Secondary | ICD-10-CM | POA: Diagnosis not present

## 2016-07-22 DIAGNOSIS — E785 Hyperlipidemia, unspecified: Secondary | ICD-10-CM | POA: Diagnosis not present

## 2016-07-22 DIAGNOSIS — Z6837 Body mass index (BMI) 37.0-37.9, adult: Secondary | ICD-10-CM | POA: Diagnosis not present

## 2016-07-22 DIAGNOSIS — M25562 Pain in left knee: Secondary | ICD-10-CM

## 2016-07-22 MED ORDER — PRAVASTATIN SODIUM 20 MG PO TABS: 20.0000 mg | ORAL_TABLET | Freq: Every day | ORAL | 3 refills | Status: DC

## 2016-07-22 NOTE — Progress Notes (Signed)
  Subjective:   Patient ID: Jodi Allen, female    DOB: 03-06-1940, 76 y.o.   MRN: [id number] CC: Follow-up (3 month) and Joint Pain (Left, intermittent, 1 month)  HPI: Jodi Allen is a 76 y.o. female presenting for Follow-up (3 month) and Joint Pain (Left, intermittent, 1 month)  HTN: no CP, no SOB Taking med regularly  Continues to have pain in L hip and knee off and on Takes ibuprofen sometimes, helps  Elevated BMI: rarely drinks sodas Limited exercise due to pain in knee, mostly L knee Sometimes slightly swollen, not usually  HLD: was not able to take atorvastatin, said "it did not agree with me" She does not remember what those symptoms were  Relevant past medical, surgical, family and social history reviewed. Allergies and medications reviewed and updated. History  Smoking Status  . Never Smoker  Smokeless Tobacco  . Never Used   ROS: Per HPI   Objective:    BP 139/74   Pulse 68   Temp (!) 96.8 F (36 C) (Oral)   Ht _0  (1.626 m)   Wt 217 lb 9.6 oz (98.7 kg)   BMI 37.35 kg/m   Wt Readings from Last 3 Encounters:  07/22/16 217 lb 9.6 oz (98.7 kg)  03/02/16 221 lb 12.8 oz (100.6 kg)  05/08/15 219 lb (99.3 kg)    Gen: NAD, alert, cooperative with exam, NCAT EYES: EOMI, no conjunctival injection, or no icterus ENT: OP without erythema LYMPH: no cervical LAD CV: NRRR, normal S1/S2, no murmur Resp: CTABL, no wheezes, normal WOB Abd: +BS, soft, NTND. no guarding or organomegaly Ext: No edema, warm Neuro: Alert and oriented MSK: knee exam limited by body habitus Knees appear symmetric No redness or obvious effusion ROM equal b/l knees, pain at extreme of flexion with L knee Able to straighten knees b/l to 180 degrees   Assessment & Plan:  [name] was seen today for follow-up and joint pain.  Diagnoses and all orders for this visit:  Chronic pain of left knee Discussed symptom care, rest, ice, low dose NSAIDs -     DG Knee 1-2 Views  Left; Future  BMI 37.0-37.9, adult Discussed lifestyle changes, avoiding sugary beverages  Hyperlipidemia, unspecified hyperlipidemia type Start statin, was prescribed lipitor but stopped bc of side effects, switch to pravastatin -     pravastatin (PRAVACHOL) 20 MG tablet; Take 1 tablet (20 mg total) by mouth daily.  Essential hypertension Adequate control, con current meds -     BMP8+EGFR; Future  Hyperglycemia -     Bayer DCA Hb A1c Waived   Follow up plan: Return in about 3 months (around 10/22/2016). Assunta Found, MD [place]

## 2016-08-07 ENCOUNTER — Other Ambulatory Visit: Payer: Self-pay | Admitting: *Deleted

## 2016-08-07 DIAGNOSIS — E785 Hyperlipidemia, unspecified: Secondary | ICD-10-CM

## 2016-08-07 DIAGNOSIS — I10 Essential (primary) hypertension: Secondary | ICD-10-CM

## 2016-08-07 DIAGNOSIS — K219 Gastro-esophageal reflux disease without esophagitis: Secondary | ICD-10-CM

## 2016-08-07 MED ORDER — HYDROCHLOROTHIAZIDE 12.5 MG PO TABS: 12.5000 mg | ORAL_TABLET | Freq: Every day | ORAL | 1 refills | Status: DC

## 2016-08-07 MED ORDER — PRAVASTATIN SODIUM 20 MG PO TABS: 20.0000 mg | ORAL_TABLET | Freq: Every day | ORAL | 3 refills | Status: DC

## 2016-08-07 MED ORDER — RANITIDINE HCL 150 MG PO TABS: 150.0000 mg | ORAL_TABLET | Freq: Two times a day (BID) | ORAL | 3 refills | Status: DC

## 2016-08-19 ENCOUNTER — Other Ambulatory Visit: Payer: Self-pay

## 2016-08-19 DIAGNOSIS — K219 Gastro-esophageal reflux disease without esophagitis: Secondary | ICD-10-CM

## 2016-08-19 DIAGNOSIS — I10 Essential (primary) hypertension: Secondary | ICD-10-CM

## 2016-08-19 MED ORDER — HYDROCHLOROTHIAZIDE 12.5 MG PO TABS: 12.5000 mg | ORAL_TABLET | Freq: Every day | ORAL | 0 refills | Status: DC

## 2016-08-19 MED ORDER — RANITIDINE HCL 150 MG PO TABS: 150.0000 mg | ORAL_TABLET | Freq: Two times a day (BID) | ORAL | 0 refills | Status: DC

## 2016-10-09 ENCOUNTER — Ambulatory Visit (INDEPENDENT_AMBULATORY_CARE_PROVIDER_SITE_OTHER): Payer: Medicare HMO | Admitting: Physician Assistant

## 2016-10-09 ENCOUNTER — Encounter: Payer: Self-pay | Admitting: Physician Assistant

## 2016-10-09 VITALS — BP 133/70 | HR 70 | Temp 96.8°F | Ht 64.0 in | Wt 213.0 lb

## 2016-10-09 DIAGNOSIS — R1030 Lower abdominal pain, unspecified: Secondary | ICD-10-CM

## 2016-10-09 LAB — URINALYSIS, COMPLETE
BILIRUBIN UA: NEGATIVE
GLUCOSE, UA: NEGATIVE
Ketones, UA: NEGATIVE
NITRITE UA: NEGATIVE
PROTEIN UA: NEGATIVE
RBC UA: NEGATIVE
Specific Gravity, UA: 1.02 (ref 1.005–1.030)
UUROB: 0.2 mg/dL (ref 0.2–1.0)
pH, UA: 7 (ref 5.0–7.5)

## 2016-10-09 LAB — MICROSCOPIC EXAMINATION
Epithelial Cells (non renal): >10 /hpf — AB (ref 0–10)
RBC MICROSCOPIC, UA: NONE SEEN /HPF (ref 0–?)
Renal Epithel, UA: NONE SEEN /hpf

## 2016-10-09 NOTE — Patient Instructions (Signed)
In a few days you may receive a survey in the mail or online from [employer] regarding your visit with us today. Please take a moment to fill this out. Your feedback is very important to our whole office. It can help us better understand your needs as well as improve your experience and satisfaction. Thank you for taking your time to complete it. We care about you.  Adriann Thau, PA-C  

## 2016-10-12 NOTE — Progress Notes (Signed)
BP 133/70   Pulse 70   Temp (!) 96.8 F (36 C) (Oral)   Ht 5\' 4"  (1.626 m)   Wt 213 lb (96.6 kg)   BMI 36.56 kg/m    Subjective:    Patient ID: Jodi Allen, female    DOB: [birth year], 76 y.o.   MRN: [id number]  HPI: Jodi Allen is a 76 y.o. female presenting on 10/09/2016 for Abdominal Pain  Had episode of a few days of lower abdominal pain without UTI symptoms. Declines change in bowel function or GI distress.  Has passed in the last few days and nearly resolved. No history of stones.  Also is due her annual screening mammogram. Order will be placed.  Relevant past medical, surgical, family and social history reviewed and updated as indicated. Allergies and medications reviewed and updated.  Past Medical History:  Diagnosis Date  . Osteopenia   . Osteoporosis     Past Surgical History:  Procedure Laterality Date  . ABDOMINAL HYSTERECTOMY      Review of Systems  Constitutional: Negative.   HENT: Negative.   Eyes: Negative.   Respiratory: Negative.   Gastrointestinal: Positive for abdominal pain. Negative for blood in stool, constipation, diarrhea, nausea and vomiting.  Genitourinary: Negative.  Negative for dysuria and flank pain.    Allergies as of 10/09/2016   No Known Allergies     Medication List       Accurate as of 10/09/16 11:59 PM. Always use your most recent med list.          fluticasone 50 MCG/ACT nasal spray Commonly known as:  FLONASE Place 1 spray into both nostrils 2 (two) times daily as needed for allergies or rhinitis.   hydrochlorothiazide 12.5 MG tablet Commonly known as:  HYDRODIURIL Take 1 tablet (12.5 mg total) by mouth daily.   loratadine 10 MG tablet Commonly known as:  CLARITIN Take 1 tablet (10 mg total) by mouth daily.   pravastatin 20 MG tablet Commonly known as:  PRAVACHOL Take 1 tablet (20 mg total) by mouth daily.   ranitidine 150 MG tablet Commonly known as:  ZANTAC Take 1 tablet (150 mg total) by mouth 2  (two) times daily.            Discharge Care Instructions        Start     Ordered   10/09/16 0000  Urinalysis, Complete     10/09/16 0848   10/09/16 0000  MM Digital Screening    Question Answer Comment  Reason for Exam (SYMPTOM  OR DIAGNOSIS REQUIRED) annual exam   Preferred imaging location? External      10/09/16 0925   10/09/16 0000  Microscopic Examination     10/09/16 0000         Objective:    BP 133/70   Pulse 70   Temp (!) 96.8 F (36 C) (Oral)   Ht 5\' 4"  (1.626 m)   Wt 213 lb (96.6 kg)   BMI 36.56 kg/m   No Known Allergies  Physical Exam  Constitutional: She is oriented to person, place, and time. She appears well-developed and well-nourished.  HENT:  Head: Normocephalic and atraumatic.  Right Ear: Tympanic membrane, external ear and ear canal normal.  Left Ear: Tympanic membrane, external ear and ear canal normal.  Nose: Nose normal. No rhinorrhea.  Mouth/Throat: Oropharynx is clear and moist and mucous membranes are normal. No oropharyngeal exudate or posterior oropharyngeal erythema.  Eyes: Pupils are equal, round, and  reactive to light. Conjunctivae and EOM are normal.  Neck: Normal range of motion. Neck supple.  Cardiovascular: Normal rate, regular rhythm, normal heart sounds and intact distal pulses.   Pulmonary/Chest: Effort normal and breath sounds normal.  Abdominal: Soft. Bowel sounds are normal.  Neurological: She is alert and oriented to person, place, and time. She has normal reflexes.  Skin: Skin is warm and dry. No rash noted.  Psychiatric: She has a normal mood and affect. Her behavior is normal. Judgment and thought content normal.    Results for orders placed or performed in visit on 10/09/16  Microscopic Examination  Result Value Ref Range   WBC, UA 6-10 (A) 0 - 5 /hpf   RBC, UA None seen 0 - 2 /hpf   Epithelial Cells (non renal) >10 (A) 0 - 10 /hpf   Renal Epithel, UA None seen None seen /hpf   Bacteria, UA Few None  seen/Few  Urinalysis, Complete  Result Value Ref Range   Specific Gravity, UA 1.020 1.005 - 1.030   pH, UA 7.0 5.0 - 7.5   Color, UA Yellow Yellow   Appearance Ur Clear Clear   Leukocytes, UA Trace (A) Negative   Protein, UA Negative Negative/Trace   Glucose, UA Negative Negative   Ketones, UA Negative Negative   RBC, UA Negative Negative   Bilirubin, UA Negative Negative   Urobilinogen, Ur 0.2 0.2 - 1.0 mg/dL   Nitrite, UA Negative Negative   Microscopic Examination See below:       Assessment & Plan:   1. Lower abdominal pain - Urinalysis, Complete - MM Digital Screening; Future    Current Outpatient Prescriptions:  .  fluticasone (FLONASE) 50 MCG/ACT nasal spray, Place 1 spray into both nostrils 2 (two) times daily as needed for allergies or rhinitis., Disp: 16 g, Rfl: 6 .  hydrochlorothiazide (HYDRODIURIL) 12.5 MG tablet, Take 1 tablet (12.5 mg total) by mouth daily., Disp: 90 tablet, Rfl: 0 .  loratadine (CLARITIN) 10 MG tablet, Take 1 tablet (10 mg total) by mouth daily., Disp: 30 tablet, Rfl: 11 .  ranitidine (ZANTAC) 150 MG tablet, Take 1 tablet (150 mg total) by mouth 2 (two) times daily., Disp: 180 tablet, Rfl: 0 .  pravastatin (PRAVACHOL) 20 MG tablet, Take 1 tablet (20 mg total) by mouth daily. (Patient not taking: Reported on 10/09/2016), Disp: 90 tablet, Rfl: 3 Continue all other maintenance medications as listed above.  Follow up plan: Return if symptoms worsen or fail to improve.  Educational handout given for survey  Remus Loffler PA-C [facility] [address]  [place], [place] [address] [phone]   10/12/2016, 10:12 PM

## 2016-12-29 ENCOUNTER — Ambulatory Visit: Payer: Medicare HMO | Admitting: Nurse Practitioner

## 2016-12-29 ENCOUNTER — Ambulatory Visit: Payer: Medicare HMO | Admitting: Family Medicine

## 2016-12-30 ENCOUNTER — Ambulatory Visit: Payer: Medicare HMO | Admitting: Family Medicine

## 2016-12-30 VITALS — BP 148/84 | HR 79 | Temp 97.8°F | Ht 64.0 in | Wt 215.0 lb

## 2016-12-30 DIAGNOSIS — N3000 Acute cystitis without hematuria: Secondary | ICD-10-CM | POA: Diagnosis not present

## 2016-12-30 DIAGNOSIS — R300 Dysuria: Secondary | ICD-10-CM | POA: Diagnosis not present

## 2016-12-30 LAB — URINALYSIS, COMPLETE
BILIRUBIN UA: NEGATIVE
GLUCOSE, UA: NEGATIVE
KETONES UA: NEGATIVE
Nitrite, UA: NEGATIVE
PROTEIN UA: NEGATIVE
RBC, UA: NEGATIVE
SPEC GRAV UA: 1.02 (ref 1.005–1.030)
Urobilinogen, Ur: 0.2 mg/dL (ref 0.2–1.0)
pH, UA: 7.5 (ref 5.0–7.5)

## 2016-12-30 LAB — MICROSCOPIC EXAMINATION: RENAL EPITHEL UA: NONE SEEN /HPF

## 2016-12-30 MED ORDER — SULFAMETHOXAZOLE-TRIMETHOPRIM 800-160 MG PO TABS: 1.0000 | ORAL_TABLET | Freq: Two times a day (BID) | ORAL | 0 refills | Status: AC

## 2016-12-30 NOTE — Progress Notes (Signed)
   Subjective: CC: urinary symptoms PCP: Johna SheriffVincent, Carol L, MD ZOX:[id number]:Jodi Allen is a 76 y.o. female presenting to clinic today for:  1. Urinary symptoms Patient reports a 5 day h/o dysuria, urinary urgency and frequency.  Denies hematuria, fevers, chills, abdominal pain, nausea, vomiting, back pain, vaginal discharge.  Patient has used AZO for symptoms.  Patient denies a h/o frequent or recurrent UTIs.     No Known Allergies Past Medical History:  Diagnosis Date  . Osteopenia   . Osteoporosis    Family History  Problem Relation Age of Onset  . Cancer Mother        BREAST REMOVED    Current Outpatient Medications:  .  fluticasone (FLONASE) 50 MCG/ACT nasal spray, Place 1 spray into both nostrils 2 (two) times daily as needed for allergies or rhinitis. (Patient not taking: Reported on 12/30/2016), Disp: 16 g, Rfl: 6 .  hydrochlorothiazide (HYDRODIURIL) 12.5 MG tablet, Take 1 tablet (12.5 mg total) by mouth daily. (Patient not taking: Reported on 12/30/2016), Disp: 90 tablet, Rfl: 0 .  ranitidine (ZANTAC) 150 MG tablet, Take 1 tablet (150 mg total) by mouth 2 (two) times daily., Disp: 180 tablet, Rfl: 0  Social Hx: non smoker. ROS: Per HPI  Objective: Office vital signs reviewed. BP (!) 148/84   Pulse 79   Temp 97.8 F (36.6 C) (Oral)   Ht 5\' 4"  (1.626 m)   Wt 215 lb (97.5 kg)   BMI 36.90 kg/m   Physical Examination:  General: Awake, alert, well nourished, nontoxic appearing female,  No acute distress Cardio: regular rate and rhythm, S1S2 heard, no murmurs appreciated Pulm: clear to auscultation bilaterally, no wheezes, rhonchi or rales; normal work of breathing on room air GU: Suprapubic tenderness to palpation present MSK: No CVA tenderness to palpation  Assessment/ Plan: 76 y.o. female   1. Acute cystitis without hematuria Patient is well-appearing on exam.  She is afebrile with normal vital signs for age.  No evidence of pyelonephritis on physical exam.  Her  urinalysis did demonstrate 1+ leukocytes.  Urine microscopy revealed large amounts of white blood cells, epithelial cells and some bacteria.  She reports having performed a clean catch.  However, it is difficult to tell based on urine microscopy.  The urine has been sent for urine culture.  Because she is symptomatic, will treat as urinary tract infection.  I reviewed her previous urine culture, which grew E. coli that was sensitive to cephalosporins and sulfa.  Septra DS was prescribed 1 p.o. twice daily by 3 days.  Will contact patient with results of urine culture if it is not sensitive to prescribed medication.  Continue oral hydration.  May use Azo for the next couple of days if needed for dysuria.Strict return precautions and reasons for emergent evaluation in the emergency department review with patient.  They voiced understanding and will follow-up as needed. - Urinalysis, Complete - Urine Culture  Orders Placed This Encounter  Procedures  . Urine Culture  . Urinalysis, Complete   Meds ordered this encounter  Medications  . sulfamethoxazole-trimethoprim (BACTRIM DS) 800-160 MG tablet    Sig: Take 1 tablet by mouth 2 (two) times daily for 3 days.    Dispense:  6 tablet    Refill:  0    Ashly Hulen SkainsM Gottschalk, DO [facility] [phone]

## 2016-12-30 NOTE — Patient Instructions (Addendum)
I value your feedback and appreciate you entrusting us with your care.  If you get a survey, I would appreciate your taking the time to let us know what your experience was like.    Urinary Tract Infection, Adult A urinary tract infection (UTI) is an infection of any part of the urinary tract. The urinary tract includes the:  Kidneys.  Ureters.  Bladder.  Urethra.  These organs make, store, and get rid of pee (urine) in the body. Follow these instructions at home:  Take over-the-counter and prescription medicines only as told by your doctor.  If you were prescribed an antibiotic medicine, take it as told by your doctor. Do not stop taking the antibiotic even if you start to feel better.  Avoid the following drinks: ? Alcohol. ? Caffeine. ? Tea. ? Carbonated drinks.  Drink enough fluid to keep your pee clear or pale yellow.  Keep all follow-up visits as told by your doctor. This is important.  Make sure to: ? Empty your bladder often and completely. Do not to hold pee for long periods of time. ? Empty your bladder before and after sex. ? Wipe from front to back after a bowel movement if you are female. Use each tissue one time when you wipe. Contact a doctor if:  You have back pain.  You have a fever.  You feel sick to your stomach (nauseous).  You throw up (vomit).  Your symptoms do not get better after 3 days.  Your symptoms go away and then come back. Get help right away if:  You have very bad back pain.  You have very bad lower belly (abdominal) pain.  You are throwing up and cannot keep down any medicines or water. This information is not intended to replace advice given to you by your health care provider. Make sure you discuss any questions you have with your health care provider. Document Released: 07/08/2007 Document Revised: 06/27/2015 Document Reviewed: 12/10/2014 Elsevier Interactive Patient Education  [employer]2018 Elsevier Inc.

## 2017-01-01 LAB — URINE CULTURE

## 2017-02-08 DIAGNOSIS — Z1231 Encounter for screening mammogram for malignant neoplasm of breast: Secondary | ICD-10-CM | POA: Diagnosis not present

## 2017-02-08 LAB — HM MAMMOGRAPHY

## 2017-04-14 ENCOUNTER — Ambulatory Visit (INDEPENDENT_AMBULATORY_CARE_PROVIDER_SITE_OTHER): Payer: Medicare HMO | Admitting: *Deleted

## 2017-04-14 ENCOUNTER — Ambulatory Visit (INDEPENDENT_AMBULATORY_CARE_PROVIDER_SITE_OTHER): Payer: Medicare HMO

## 2017-04-14 ENCOUNTER — Encounter: Payer: Self-pay | Admitting: *Deleted

## 2017-04-14 VITALS — BP 149/77 | HR 69 | Ht 64.0 in | Wt 214.0 lb

## 2017-04-14 DIAGNOSIS — M8588 Other specified disorders of bone density and structure, other site: Secondary | ICD-10-CM

## 2017-04-14 DIAGNOSIS — M8589 Other specified disorders of bone density and structure, multiple sites: Secondary | ICD-10-CM

## 2017-04-14 DIAGNOSIS — Z0000 Encounter for general adult medical examination without abnormal findings: Secondary | ICD-10-CM | POA: Diagnosis not present

## 2017-04-14 DIAGNOSIS — Z23 Encounter for immunization: Secondary | ICD-10-CM | POA: Diagnosis not present

## 2017-04-14 DIAGNOSIS — Z780 Asymptomatic menopausal state: Secondary | ICD-10-CM | POA: Diagnosis not present

## 2017-04-14 NOTE — Progress Notes (Addendum)
Subjective:   Jodi Allen is a 77 y.o. female who presents for an Initial Medicare Annual Wellness Visit. Jodi Allen lives at home with her husband. She is retired from the [employer] and enjoys going to [employer] to exercise and yoga class twice a week.   Review of Systems    Health is about the same as last year  Cardiac Risk Factors include: advanced age (>75men, >37 women);sedentary lifestyle;obesity (BMI >30kg/m2);hypertension  Musc: intermittent left shoulder pain  Objective:    Today's Vitals   04/14/17 1114 04/14/17 1116  BP: (!) 149/77   Pulse: 69   Weight: 214 lb (97.1 kg)   Height: 5\' 4"  (1.626 m)   PainSc:  3    Body mass index is 36.73 kg/m.  No flowsheet data found.  Current Medications (verified) Outpatient Encounter Medications as of 04/14/2017  Medication Sig  . fluticasone (FLONASE) 50 MCG/ACT nasal spray Place 1 spray into both nostrils 2 (two) times daily as needed for allergies or rhinitis.  . hydrochlorothiazide (HYDRODIURIL) 12.5 MG tablet Take 1 tablet (12.5 mg total) by mouth daily.  . ranitidine (ZANTAC) 150 MG tablet Take 1 tablet (150 mg total) by mouth 2 (two) times daily.   No facility-administered encounter medications on file as of 04/14/2017.     Allergies (verified) Patient has no known allergies.   History: Past Medical History:  Diagnosis Date  . Osteopenia   . Osteoporosis    Past Surgical History:  Procedure Laterality Date  . ABDOMINAL HYSTERECTOMY     Family History  Problem Relation Age of Onset  . Cancer Mother        BREAST REMOVED  . Leukemia Sister   . Bladder Cancer Brother   . Arthritis Brother    Social History   Socioeconomic History  . Marital status: Married    Spouse name: Not on file  . Number of children: 6  . Years of education: 17  . Highest education level: 11th grade  Social Needs  . Financial resource strain: Somewhat hard  . Food insecurity - worry: Never true  . Food  insecurity - inability: Never true  . Transportation needs - medical: No  . Transportation needs - non-medical: No  Occupational History  . Occupation: retired    Associate Professor: [employer]  Tobacco Use  . Smoking status: Never Smoker  . Smokeless tobacco: Never Used  Substance and Sexual Activity  . Alcohol use: No  . Drug use: No  . Sexual activity: Yes  Other Topics Concern  . Not on file  Social History Narrative  . Not on file    Clinical Intake:  Pre-visit preparation completed: No  Pain : 0-10 Pain Score: 3  Pain Location: Shoulder Pain Orientation: Left Pain Descriptors / Indicators: Aching Pain Onset: More than a month ago Pain Frequency: Intermittent Pain Relieving Factors: Advil and rest Effect of Pain on Daily Activities: minimal  Pain Relieving Factors: Advil and rest  Nutritional Status: BMI > 30  Obese Diabetes: No  How often do you need to have someone help you when you read instructions, pamphlets, or other written materials from your doctor or pharmacy?: 2 - Rarely What is the last grade level you completed in school?: 11th  Interpreter Needed?: No  Information entered by :: Demetrios Loll, RN   Activities of Daily Living In your present state of health, do you have any difficulty performing the following activities: 04/14/2017  Hearing? N  Vision?  N  Comment Eye exam is up to date  Difficulty concentrating or making decisions? N  Walking or climbing stairs? N  Dressing or bathing? N  Doing errands, shopping? N  Preparing Food and eating ? N  Using the Toilet? N  In the past six months, have you accidently leaked urine? Y  Comment stress and urge incontinence  Do you have problems with loss of bowel control? N  Managing your Medications? N  Managing your Finances? N  Housekeeping or managing your Housekeeping? N  Some recent data might be hidden     Immunizations and Health Maintenance Immunization History  Administered Date(s) Administered  .  Influenza, Seasonal, Injecte, Preservative Fre 12/03/2012  . Influenza,inj,Quad PF,6+ Mos 03/10/2016  . Influenza-Unspecified 12/03/2012  . Pneumococcal Conjugate-13 04/14/2017  . Tdap 06/27/2015   There are no preventive care reminders to display for this patient.  Patient Care Team: Johna Sheriff, MD as PCP - General (Pediatrics)   No hospitalizations, ER visits, or surgeries this past year.       Assessment:   This is a routine wellness examination for Jodi Allen.  Hearing/Vision screen No deficits noted during visit.   Dietary issues and exercise activities discussed: Current Exercise Habits: Structured exercise class, Type of exercise: yoga, Time (Minutes): 60, Frequency (Times/Week): 2, Weekly Exercise (Minutes/Week): 120  Goals    . Exercise 150 min/wk Moderate Activity      Depression Screen PHQ 2/9 Scores 12/30/2016 10/09/2016 07/22/2016 03/02/2016 05/08/2015 02/21/2015 10/10/2013  PHQ - 2 Score 0 0 0 0 0 0 0    Fall Risk Fall Risk  12/30/2016 10/09/2016 07/22/2016 03/02/2016 05/08/2015  Falls in the past year? No No No No No   Cognitive Function: MMSE - Mini Mental State Exam 04/14/2017  Orientation to time 5  Orientation to Place 5  Registration 3  Attention/ Calculation 5  Recall 2  Language- name 2 objects 2  Language- repeat 1  Language- follow 3 step command 3  Language- read & follow direction 1  Write a sentence 1  Copy design 1  Total score 29        Screening Tests Health Maintenance  Topic Date Due  . INFLUENZA VACCINE  07/03/2017 (Originally 09/02/2016)  . PNA vac Low Risk Adult (2 of 2 - PPSV23) 04/15/2018  . MAMMOGRAM  02/09/2019  . COLONOSCOPY  03/10/2020  . TETANUS/TDAP  06/26/2025  . DEXA SCAN  Completed      Plan:  Continue to stay active. Aim for 150 min of moderate activity a week.  Keep f/u with PCP Prevnar today Empty bladder every 3 hours Increase water intake Dexa scan today  I have personally reviewed and noted the following  in the patient's chart:   . Medical and social history . Use of alcohol, tobacco or illicit drugs  . Current medications and supplements . Functional ability and status . Nutritional status . Physical activity . Advanced directives . List of other physicians . Hospitalizations, surgeries, and ER visits in previous 12 months . Vitals . Screenings to include cognitive, depression, and falls . Referrals and appointments  In addition, I have reviewed and discussed with patient certain preventive protocols, quality metrics, and best practice recommendations. A written personalized care plan for preventive services as well as general preventive health recommendations were provided to patient.     Demetrios Loll, RN  04/14/2017    I have reviewed and agree with the above AWV documentation.   Rex Kras, MD [place]  [facility] 04/14/2017, 5:04 PM

## 2017-04-14 NOTE — Patient Instructions (Addendum)
Ms. Borak , Thank you for taking time to come for your Medicare Wellness Visit. I appreciate your ongoing commitment to your health goals. Please review the following plan we discussed and let me know if I can assist you in the future.   These are the goals we discussed: Goals    . Exercise 150 min/wk Moderate Activity       This is a list of the screening recommended for you and due dates:  Health Maintenance  Topic Date Due  . Flu Shot  07/03/2017*  . Pneumonia vaccines (2 of 2 - PPSV23) 04/15/2018  . Mammogram  02/09/2019  . Colon Cancer Screening  03/10/2020  . Tetanus Vaccine  06/26/2025  . DEXA scan (bone density measurement)  Completed  *Topic was postponed. The date shown is not the original due date.    Pneumococcal Conjugate Vaccine suspension for injection What is this medicine? PNEUMOCOCCAL VACCINE (NEU mo KOK al vak SEEN) is a vaccine used to prevent pneumococcus bacterial infections. These bacteria can cause serious infections like pneumonia, meningitis, and blood infections. This vaccine will lower your chance of getting pneumonia. If you do get pneumonia, it can make your symptoms milder and your illness shorter. This vaccine will not treat an infection and will not cause infection. This vaccine is recommended for infants and young children, adults with certain medical conditions, and adults 65 years or older. This medicine may be used for other purposes; ask your health care provider or pharmacist if you have questions. COMMON BRAND NAME(S): Prevnar, Prevnar 13 What should I tell my health care provider before I take this medicine? They need to know if you have any of these conditions: -bleeding problems -fever -immune system problems -an unusual or allergic reaction to pneumococcal vaccine, diphtheria toxoid, other vaccines, latex, other medicines, foods, dyes, or preservatives -pregnant or trying to get pregnant -breast-feeding How should I use this  medicine? This vaccine is for injection into a muscle. It is given by a health care professional. A copy of Vaccine Information Statements will be given before each vaccination. Read this sheet carefully each time. The sheet may change frequently. Talk to your pediatrician regarding the use of this medicine in children. While this drug may be prescribed for children as young as 84 weeks old for selected conditions, precautions do apply. Overdosage: If you think you have taken too much of this medicine contact a poison control center or emergency room at once. NOTE: This medicine is only for you. Do not share this medicine with others. What if I miss a dose? It is important not to miss your dose. Call your doctor or health care professional if you are unable to keep an appointment. What may interact with this medicine? -medicines for cancer chemotherapy -medicines that suppress your immune function -steroid medicines like prednisone or cortisone This list may not describe all possible interactions. Give your health care provider a list of all the medicines, herbs, non-prescription drugs, or dietary supplements you use. Also tell them if you smoke, drink alcohol, or use illegal drugs. Some items may interact with your medicine. What should I watch for while using this medicine? Mild fever and pain should go away in 3 days or less. Report any unusual symptoms to your doctor or health care professional. What side effects may I notice from receiving this medicine? Side effects that you should report to your doctor or health care professional as soon as possible: -allergic reactions like skin rash, itching or hives,  swelling of the face, lips, or tongue -breathing problems -confused -fast or irregular heartbeat -fever over 102 degrees F -seizures -unusual bleeding or bruising -unusual muscle weakness Side effects that usually do not require medical attention (report to your doctor or health care  professional if they continue or are bothersome): -aches and pains -diarrhea -fever of 102 degrees F or less -headache -irritable -loss of appetite -pain, tender at site where injected -trouble sleeping This list may not describe all possible side effects. Call your doctor for medical advice about side effects. You may report side effects to FDA at 1-800-FDA-1088. Where should I keep my medicine? This does not apply. This vaccine is given in a clinic, pharmacy, doctor's office, or other health care setting and will not be stored at home. NOTE: This sheet is a summary. It may not cover all possible information. If you have questions about this medicine, talk to your doctor, pharmacist, or health care provider.  2018 Elsevier/Gold Standard (2013-10-26 10:27:27)

## 2017-04-15 DIAGNOSIS — M8589 Other specified disorders of bone density and structure, multiple sites: Secondary | ICD-10-CM | POA: Diagnosis not present

## 2017-04-15 DIAGNOSIS — Z780 Asymptomatic menopausal state: Secondary | ICD-10-CM | POA: Diagnosis not present

## 2017-05-20 ENCOUNTER — Encounter: Payer: Self-pay | Admitting: Pediatrics

## 2017-05-20 ENCOUNTER — Ambulatory Visit (INDEPENDENT_AMBULATORY_CARE_PROVIDER_SITE_OTHER): Payer: Medicare HMO | Admitting: Pediatrics

## 2017-05-20 VITALS — BP 131/77 | HR 67 | Temp 96.7°F | Ht 64.0 in | Wt 216.6 lb

## 2017-05-20 DIAGNOSIS — M25512 Pain in left shoulder: Secondary | ICD-10-CM | POA: Diagnosis not present

## 2017-05-20 DIAGNOSIS — R2232 Localized swelling, mass and lump, left upper limb: Secondary | ICD-10-CM | POA: Diagnosis not present

## 2017-05-20 DIAGNOSIS — R7303 Prediabetes: Secondary | ICD-10-CM

## 2017-05-20 DIAGNOSIS — E785 Hyperlipidemia, unspecified: Secondary | ICD-10-CM | POA: Diagnosis not present

## 2017-05-20 LAB — BMP8+EGFR
BUN/Creatinine Ratio: 11 — ABNORMAL LOW (ref 12–28)
BUN: 10 mg/dL (ref 8–27)
CALCIUM: 9.3 mg/dL (ref 8.7–10.3)
CHLORIDE: 102 mmol/L (ref 96–106)
CO2: 24 mmol/L (ref 20–29)
Creatinine, Ser: 0.89 mg/dL (ref 0.57–1.00)
GFR calc Af Amer: 74 mL/min/{1.73_m2} (ref >59)
GFR calc non Af Amer: 64 mL/min/{1.73_m2} (ref >59)
GLUCOSE: 92 mg/dL (ref 65–99)
POTASSIUM: 4.6 mmol/L (ref 3.5–5.2)
Sodium: 140 mmol/L (ref 134–144)

## 2017-05-20 LAB — BAYER DCA HB A1C WAIVED: HB A1C: 5.1 % (ref <7.0)

## 2017-05-20 MED ORDER — PRAVASTATIN SODIUM 20 MG PO TABS: 20.0000 mg | ORAL_TABLET | Freq: Every day | ORAL | 3 refills | Status: DC

## 2017-05-20 NOTE — Progress Notes (Signed)
  Subjective:   Patient ID: Jodi Allen, female    DOB: [birth year], 77 y.o.   MRN: [id number] CC: Follow-up and Neck Pain (Left)  HPI: Jodi Allen is a 77 y.o. female presenting for Follow-up and Neck Pain (Left)  Goes to silver sneakers Tu and wed, past 2-3 weeks has had pain L side of neck into her left shoulder.  Has not had problems with her shoulder in the past.  No known injuries.  She notices it with certain movements, lifting her arm over her head.  Points to soft tissue between her neck and her shoulder with where the pain bothers him most, it does go down into her upper left arm.  No redness.  No fevers.  Hypertension: Not taking blood pressure medicine regularly.  Says she takes it when she thinks her blood pressure is elevated.  She does check her blood pressure regularly at home.  She says systolics are usually in the 130s.  She does not remember it being over that.  Prediabetes: Drinking some sodas most days.  Relevant past medical, surgical, family and social history reviewed. Allergies and medications reviewed and updated. Social History   Tobacco Use  Smoking Status Never Smoker  Smokeless Tobacco Never Used   ROS: Per HPI   Objective:    BP 131/77   Pulse 67   Temp (!) 96.7 F (35.9 C) (Oral)   Ht _0  (1.626 m)   Wt 216 lb 9.6 oz (98.2 kg)   BMI 37.18 kg/m   Wt Readings from Last 3 Encounters:  05/20/17 216 lb 9.6 oz (98.2 kg)  04/14/17 214 lb (97.1 kg)  12/30/16 215 lb (97.5 kg)    Gen: NAD, alert, cooperative with exam, NCAT EYES: EOMI, no conjunctival injection, or no icterus ENT: OP without erythema LYMPH: no cervical LAD CV: NRRR, normal S1/S2, no murmur, distal pulses 2+ b/l Resp: CTABL, no wheezes, normal WOB Abd: +BS, soft, NTND. no guarding or organomegaly Ext: No edema, warm Neuro: Alert and oriented, strength equal b/l UE and LE, coordination grossly normal MSK: Some pain left shoulder raising arm over her head.  Normal range  of motion.  Rotator cuff muscles intact.  Approximately 1.5 cm soft tissue mass left posterior shoulder.  Somewhat mobile.  Nontender.  Assessment & Plan:  Jodi Allen was seen today for follow-up and neck pain.  Diagnoses and all orders for this visit:  Hyperlipidemia, unspecified hyperlipidemia type -     BMP8+EGFR -     pravastatin (PRAVACHOL) 20 MG tablet; Take 1 tablet (20 mg total) by mouth daily.  Pre-diabetes -     Bayer DCA Hb A1c Waived -     BMP8+EGFR  Acute pain of left shoulder -     [place] LT UPPER EXTREM LTD SOFT TISSUE NON VASCULAR; Future -     DG Shoulder Left; Future  Mass of skin of left shoulder -     [place] LT UPPER EXTREM LTD SOFT TISSUE NON VASCULAR; Future -     DG Shoulder Left; Future   Follow up plan: Return in about 3 months (around 08/19/2017). Assunta Found, MD [place]

## 2017-05-20 NOTE — Patient Instructions (Signed)
DASH Eating Plan DASH stands for "Dietary Approaches to Stop Hypertension." The DASH eating plan is a healthy eating plan that has been shown to reduce high blood pressure (hypertension). It may also reduce your risk for type 2 diabetes, heart disease, and stroke. The DASH eating plan may also help with weight loss. What are tips for following this plan? General guidelines  Avoid eating more than 2,300 mg (milligrams) of salt (sodium) a day. If you have hypertension, you may need to reduce your sodium intake to 1,500 mg a day.  Limit alcohol intake to no more than 1 drink a day for nonpregnant women and 2 drinks a day for men. One drink equals 12 oz of beer, 5 oz of wine, or 1 oz of hard liquor.  Work with your health care provider to maintain a healthy body weight or to lose weight. Ask what an ideal weight is for you.  Get at least 30 minutes of exercise that causes your heart to beat faster (aerobic exercise) most days of the week. Activities may include walking, swimming, or biking.  Work with your health care provider or diet and nutrition specialist (dietitian) to adjust your eating plan to your individual calorie needs. Reading food labels  Check food labels for the amount of sodium per serving. Choose foods with less than 5 percent of the Daily Value of sodium. Generally, foods with less than 300 mg of sodium per serving fit into this eating plan.  To find whole grains, look for the word "whole" as the first word in the ingredient list. Shopping  Buy products labeled as "low-sodium" or "no salt added."  Buy fresh foods. Avoid canned foods and premade or frozen meals. Cooking  Avoid adding salt when cooking. Use salt-free seasonings or herbs instead of table salt or sea salt. Check with your health care provider or pharmacist before using salt substitutes.  Do not fry foods. Cook foods using healthy methods such as baking, boiling, grilling, and broiling instead.  Cook with  heart-healthy oils, such as olive, canola, soybean, or sunflower oil. Meal planning   Eat a balanced diet that includes: ? 5 or more servings of fruits and vegetables each day. At each meal, try to fill half of your plate with fruits and vegetables. ? Up to 6-8 servings of whole grains each day. ? Less than 6 oz of lean meat, poultry, or fish each day. A 3-oz serving of meat is about the same size as a deck of cards. One egg equals 1 oz. ? 2 servings of low-fat dairy each day. ? A serving of nuts, seeds, or beans 5 times each week. ? Heart-healthy fats. Healthy fats called Omega-3 fatty acids are found in foods such as flaxseeds and coldwater fish, like sardines, salmon, and mackerel.  Limit how much you eat of the following: ? Canned or prepackaged foods. ? Food that is high in trans fat, such as fried foods. ? Food that is high in saturated fat, such as fatty meat. ? Sweets, desserts, sugary drinks, and other foods with added sugar. ? Full-fat dairy products.  Do not salt foods before eating.  Try to eat at least 2 vegetarian meals each week.  Eat more home-cooked food and less restaurant, buffet, and fast food.  When eating at a restaurant, ask that your food be prepared with less salt or no salt, if possible. What foods are recommended? The items listed may not be a complete list. Talk with your dietitian about what   dietary choices are best for you. Grains Whole-grain or whole-wheat bread. Whole-grain or whole-wheat pasta. Brown rice. Oatmeal. Quinoa. Bulgur. Whole-grain and low-sodium cereals. Pita bread. Low-fat, low-sodium crackers. Whole-wheat flour tortillas. Vegetables Fresh or frozen vegetables (raw, steamed, roasted, or grilled). Low-sodium or reduced-sodium tomato and vegetable juice. Low-sodium or reduced-sodium tomato sauce and tomato paste. Low-sodium or reduced-sodium canned vegetables. Fruits All fresh, dried, or frozen fruit. Canned fruit in natural juice (without  added sugar). Meat and other protein foods Skinless chicken or turkey. Ground chicken or turkey. Pork with fat trimmed off. Fish and seafood. Egg whites. Dried beans, peas, or lentils. Unsalted nuts, nut butters, and seeds. Unsalted canned beans. Lean cuts of beef with fat trimmed off. Low-sodium, lean deli meat. Dairy Low-fat (1%) or fat-free (skim) milk. Fat-free, low-fat, or reduced-fat cheeses. Nonfat, low-sodium ricotta or cottage cheese. Low-fat or nonfat yogurt. Low-fat, low-sodium cheese. Fats and oils Soft margarine without trans fats. Vegetable oil. Low-fat, reduced-fat, or light mayonnaise and salad dressings (reduced-sodium). Canola, safflower, olive, soybean, and sunflower oils. Avocado. Seasoning and other foods Herbs. Spices. Seasoning mixes without salt. Unsalted popcorn and pretzels. Fat-free sweets. What foods are not recommended? The items listed may not be a complete list. Talk with your dietitian about what dietary choices are best for you. Grains Baked goods made with fat, such as croissants, muffins, or some breads. Dry pasta or rice meal packs. Vegetables Creamed or fried vegetables. Vegetables in a cheese sauce. Regular canned vegetables (not low-sodium or reduced-sodium). Regular canned tomato sauce and paste (not low-sodium or reduced-sodium). Regular tomato and vegetable juice (not low-sodium or reduced-sodium). Pickles. Olives. Fruits Canned fruit in a light or heavy syrup. Fried fruit. Fruit in cream or butter sauce. Meat and other protein foods Fatty cuts of meat. Ribs. Fried meat. Bacon. Sausage. Bologna and other processed lunch meats. Salami. Fatback. Hotdogs. Bratwurst. Salted nuts and seeds. Canned beans with added salt. Canned or smoked fish. Whole eggs or egg yolks. Chicken or turkey with skin. Dairy Whole or 2% milk, cream, and half-and-half. Whole or full-fat cream cheese. Whole-fat or sweetened yogurt. Full-fat cheese. Nondairy creamers. Whipped toppings.  Processed cheese and cheese spreads. Fats and oils Butter. Stick margarine. Lard. Shortening. Ghee. Bacon fat. Tropical oils, such as coconut, palm kernel, or palm oil. Seasoning and other foods Salted popcorn and pretzels. Onion salt, garlic salt, seasoned salt, table salt, and sea salt. Worcestershire sauce. Tartar sauce. Barbecue sauce. Teriyaki sauce. Soy sauce, including reduced-sodium. Steak sauce. Canned and packaged gravies. Fish sauce. Oyster sauce. Cocktail sauce. Horseradish that you find on the shelf. Ketchup. Mustard. Meat flavorings and tenderizers. Bouillon cubes. Hot sauce and Tabasco sauce. Premade or packaged marinades. Premade or packaged taco seasonings. Relishes. Regular salad dressings. Where to find more information:  National Heart, Lung, and Blood Institute: www.nhlbi.nih.gov  American Heart Association: www.heart.org Summary  The DASH eating plan is a healthy eating plan that has been shown to reduce high blood pressure (hypertension). It may also reduce your risk for type 2 diabetes, heart disease, and stroke.  With the DASH eating plan, you should limit salt (sodium) intake to 2,300 mg a day. If you have hypertension, you may need to reduce your sodium intake to 1,500 mg a day.  When on the DASH eating plan, aim to eat more fresh fruits and vegetables, whole grains, lean proteins, low-fat dairy, and heart-healthy fats.  Work with your health care provider or diet and nutrition specialist (dietitian) to adjust your eating plan to your individual   calorie needs. This information is not intended to replace advice given to you by your health care provider. Make sure you discuss any questions you have with your health care provider. Document Released: 01/08/2011 Document Revised: 01/13/2016 Document Reviewed: 01/13/2016 Elsevier Interactive Patient Education  2018 Else[employer] Inc.  

## 2017-06-01 ENCOUNTER — Ambulatory Visit (HOSPITAL_COMMUNITY): Payer: Medicare HMO

## 2018-03-02 DIAGNOSIS — Z1231 Encounter for screening mammogram for malignant neoplasm of breast: Secondary | ICD-10-CM | POA: Diagnosis not present

## 2018-04-25 ENCOUNTER — Ambulatory Visit (INDEPENDENT_AMBULATORY_CARE_PROVIDER_SITE_OTHER): Payer: Medicare HMO | Admitting: Family Medicine

## 2018-04-25 ENCOUNTER — Encounter: Payer: Self-pay | Admitting: Family Medicine

## 2018-04-25 ENCOUNTER — Other Ambulatory Visit: Payer: Self-pay

## 2018-04-25 VITALS — BP 146/78 | HR 69 | Temp 98.1°F | Ht 64.0 in | Wt 221.0 lb

## 2018-04-25 DIAGNOSIS — K219 Gastro-esophageal reflux disease without esophagitis: Secondary | ICD-10-CM | POA: Diagnosis not present

## 2018-04-25 DIAGNOSIS — M8589 Other specified disorders of bone density and structure, multiple sites: Secondary | ICD-10-CM

## 2018-04-25 DIAGNOSIS — E782 Mixed hyperlipidemia: Secondary | ICD-10-CM | POA: Diagnosis not present

## 2018-04-25 DIAGNOSIS — R1032 Left lower quadrant pain: Secondary | ICD-10-CM | POA: Diagnosis not present

## 2018-04-25 DIAGNOSIS — I10 Essential (primary) hypertension: Secondary | ICD-10-CM | POA: Diagnosis not present

## 2018-04-25 DIAGNOSIS — R739 Hyperglycemia, unspecified: Secondary | ICD-10-CM | POA: Diagnosis not present

## 2018-04-25 DIAGNOSIS — Z130 Encounter for screening for diseases of the blood and blood-forming organs and certain disorders involving the immune mechanism: Secondary | ICD-10-CM | POA: Diagnosis not present

## 2018-04-25 LAB — BAYER DCA HB A1C WAIVED: HB A1C (BAYER DCA - WAIVED): 5.2 % (ref <7.0)

## 2018-04-25 MED ORDER — OMEPRAZOLE 20 MG PO CPDR: 20.0000 mg | DELAYED_RELEASE_CAPSULE | Freq: Every day | ORAL | 3 refills | Status: DC

## 2018-04-25 NOTE — Patient Instructions (Addendum)
Your goal blood pressure is less than 150/90.  If either are above the range, this is considered uncontrolled.  Please keep an eye on your blood pressures and call me in about 2 weeks with them.  We may need to start you back on blood pressure medication.  I sent in Omeprazole for you to have on hand for acid reflux that is not controlled with modifying your diet.  You had labs performed today.  You will be contacted with the results of the labs once they are available, usually in the next 3 business days for routine lab work.  If you had a pap smear or biopsy performed, expect to be contacted in about 7-10 days.  Keep an eye on the left lower abdominal pain.  If you start developing diarrhea, blood in stool nausea, fever or pain that does not go away, you need to be reevaluated.  Today's exam was normal.  Food Choices for Gastroesophageal Reflux Disease, Adult When you have gastroesophageal reflux disease (GERD), the foods you eat and your eating habits are very important. Choosing the right foods can help ease your discomfort. Think about working with a nutrition specialist (dietitian) to help you make good choices. What are tips for following this plan?  Meals  Choose healthy foods that are low in fat, such as fruits, vegetables, whole grains, low-fat dairy products, and lean meat, fish, and poultry.  Eat small meals often instead of 3 large meals a day. Eat your meals slowly, and in a place where you are relaxed. Avoid bending over or lying down until 2-3 hours after eating.  Avoid eating meals 2-3 hours before bed.  Avoid drinking a lot of liquid with meals.  Cook foods using methods other than frying. Bake, grill, or broil food instead.  Avoid or limit: ? Chocolate. ? Peppermint or spearmint. ? Alcohol. ? Pepper. ? Black and decaffeinated coffee. ? Black and decaffeinated tea. ? Bubbly (carbonated) soft drinks. ? Caffeinated energy drinks and soft drinks.  Limit high-fat  foods such as: ? Fatty meat or fried foods. ? Whole milk, cream, butter, or ice cream. ? Nuts and nut butters. ? Pastries, donuts, and sweets made with butter or shortening.  Avoid foods that cause symptoms. These foods may be different for everyone. Common foods that cause symptoms include: ? Tomatoes. ? Oranges, lemons, and limes. ? Peppers. ? Spicy food. ? Onions and garlic. ? Vinegar. Lifestyle  Maintain a healthy weight. Ask your doctor what weight is healthy for you. If you need to lose weight, work with your doctor to do so safely.  Exercise for at least 30 minutes for 5 or more days each week, or as told by your doctor.  Wear loose-fitting clothes.  Do not smoke. If you need help quitting, ask your doctor.  Sleep with the head of your bed higher than your feet. Use a wedge under the mattress or blocks under the bed frame to raise the head of the bed. Summary  When you have gastroesophageal reflux disease (GERD), food and lifestyle choices are very important in easing your symptoms.  Eat small meals often instead of 3 large meals a day. Eat your meals slowly, and in a place where you are relaxed.  Limit high-fat foods such as fatty meat or fried foods.  Avoid bending over or lying down until 2-3 hours after eating.  Avoid peppermint and spearmint, caffeine, alcohol, and chocolate. This information is not intended to replace advice given to you by  your health care provider. Make sure you discuss any questions you have with your health care provider. Document Released: 07/21/2011 Document Revised: 02/25/2016 Document Reviewed: 02/25/2016 Elsevier Interactive Patient Education  2019 [employer].

## 2018-04-25 NOTE — Progress Notes (Signed)
Subjective: CC: Hypertension, hyperlipidemia, GERD PCP: Janora Norlander, DO ITG:[id number] Jodi Allen is a 78 y.o. female presenting to clinic today for:  1.  Hypertension, hyperlipidemia Patient reports that she was previously treated for hypertension but this was discontinued because her blood pressures were within normal limits.  She is not sure when she stopped pravastatin for cholesterol.  She does try to watch her diet, specifically because otherwise she will have increased acid reflux symptoms.  She goes on to state that she would like to resume use of acid reflux medication if possible.  She denies any hematochezia, melena, nausea, vomiting or unplanned weight loss.  No chest pain, shortness of breath.  2.  Left lower quadrant belly pain Patient reports about a month history of intermittent left-sided low abdominal pain.  She denies any associated nausea, vomiting, diarrhea or constipation.  Bowel movements are normal.  Again, no unplanned weight loss.  The left lower quadrant abdominal pain starts and resolves at random.  The symptoms last only a couple of seconds to minutes before resolving independently.  Denies any abnormal vaginal discharge, dysuria, hematuria, urgency.   ROS: Per HPI  No Known Allergies Past Medical History:  Diagnosis Date  . GERD (gastroesophageal reflux disease)   . Osteopenia   . Osteoporosis    No current outpatient medications on file. Social History   Socioeconomic History  . Marital status: Married    Spouse name: Not on file  . Number of children: 6  . Years of education: 35  . Highest education level: 11th grade  Occupational History  . Occupation: retired    Fish farm manager: [employer]  Social Needs  . Financial resource strain: Somewhat hard  . Food insecurity:    Worry: Never true    Inability: Never true  . Transportation needs:    Medical: No    Non-medical: No  Tobacco Use  . Smoking status: Never Smoker  . Smokeless tobacco: Never  Used  Substance and Sexual Activity  . Alcohol use: No  . Drug use: No  . Sexual activity: Yes  Lifestyle  . Physical activity:    Days per week: 2 days    Minutes per session: 120 min  . Stress: Only a little  Relationships  . Social connections:    Talks on phone: More than three times a week    Gets together: More than three times a week    Attends religious service: More than 4 times per year    Active member of club or organization: Yes    Attends meetings of clubs or organizations: More than 4 times per year    Relationship status: Married  . Intimate partner violence:    Fear of current or ex partner: No    Emotionally abused: No    Physically abused: No    Forced sexual activity: No  Other Topics Concern  . Not on file  Social History Narrative  . Not on file   Family History  Problem Relation Age of Onset  . Cancer Mother        BREAST REMOVED  . Leukemia Sister   . Bladder Cancer Brother   . Arthritis Brother     Objective: Office vital signs reviewed. BP (!) 146/78   Pulse 69   Temp 98.1 F (36.7 C) (Oral)   Ht 5' 4"  (1.626 Jodi)   Wt 221 lb (100.2 kg)   BMI 37.93 kg/Jodi   Physical Examination:  General: Awake, alert, well nourished,  No acute distress HEENT: Normal, sclera white, MMM Cardio: regular rate and rhythm, S1S2 heard, no murmurs appreciated Pulm: clear to auscultation bilaterally, no wheezes, rhonchi or rales; normal work of breathing on room air GI: soft, non-tender, non-distended, bowel sounds present x4, no hepatomegaly, no splenomegaly, no masses  Assessment/ Plan: 78 y.o. female   1. Essential hypertension Borderline controlled.  We discussed monitoring blood pressures at home and calling me in 2 weeks with recordings.  Goal BP less than 150/90.  If she has elevated blood pressures at home, could consider low-dose Norvasc for treatment.  2. Mixed hyperlipidemia Check fasting lipid panel.  Unsure of why she stopped pravastatin.  May  need to resume. - CMP14+EGFR - TSH - Lipid Panel  3. Hyperglycemia - CMP14+EGFR - Bayer DCA Hb A1c Waived  4. Osteopenia of multiple sites - CMP14+EGFR - VITAMIN D 25 Hydroxy (Vit-D Deficiency, Fractures)  5. Gastroesophageal reflux disease without esophagitis Not controlled.  Start omeprazole 20 mg daily as needed GERD.  Previously prescribed ranitidine and omeprazole.  Handout provided with how to modify diet to reduce GERD symptoms. - omeprazole (PRILOSEC) 20 MG capsule; Take 1 capsule (20 mg total) by mouth daily.  Dispense: 30 capsule; Refill: 3  6. Screening, anemia, deficiency, iron - CBC  7. LLQ abdominal pain Uncertain etiology.  Diagnoses considered include constipation versus diverticulitis versus ureteral irritation/UTI versus hip pathology.  Pain is not reproducible on exam.  She was asymptomatic during today's exam.  She will continue to monitor closely; we discussed red flag symptoms and reasons for emergent evaluation.   Orders Placed This Encounter  Procedures  . CMP14+EGFR  . CBC  . TSH  . Lipid Panel  . Bayer DCA Hb A1c Waived  . VITAMIN D 25 Hydroxy (Vit-D Deficiency, Fractures)   Meds ordered this encounter  Medications  . omeprazole (PRILOSEC) 20 MG capsule    Sig: Take 1 capsule (20 mg total) by mouth daily.    Dispense:  30 capsule    Refill:  [place], [place] [phone]

## 2018-04-26 ENCOUNTER — Other Ambulatory Visit: Payer: Self-pay | Admitting: Family Medicine

## 2018-04-26 DIAGNOSIS — E559 Vitamin D deficiency, unspecified: Secondary | ICD-10-CM

## 2018-04-26 LAB — CMP14+EGFR
A/G RATIO: 1.4 (ref 1.2–2.2)
ALT: 13 IU/L (ref 0–32)
AST: 19 IU/L (ref 0–40)
Albumin: 4.2 g/dL (ref 3.7–4.7)
Alkaline Phosphatase: 97 IU/L (ref 39–117)
BILIRUBIN TOTAL: 0.6 mg/dL (ref 0.0–1.2)
BUN/Creatinine Ratio: 11 — ABNORMAL LOW (ref 12–28)
BUN: 11 mg/dL (ref 8–27)
CHLORIDE: 100 mmol/L (ref 96–106)
CO2: 25 mmol/L (ref 20–29)
Calcium: 9.4 mg/dL (ref 8.7–10.3)
Creatinine, Ser: 1.02 mg/dL — ABNORMAL HIGH (ref 0.57–1.00)
GFR calc non Af Amer: 54 mL/min/{1.73_m2} — ABNORMAL LOW (ref >59)
GFR, EST AFRICAN AMERICAN: 62 mL/min/{1.73_m2} (ref >59)
Globulin, Total: 3 g/dL (ref 1.5–4.5)
Glucose: 89 mg/dL (ref 65–99)
POTASSIUM: 4.5 mmol/L (ref 3.5–5.2)
Sodium: 138 mmol/L (ref 134–144)
Total Protein: 7.2 g/dL (ref 6.0–8.5)

## 2018-04-26 LAB — CBC
Hematocrit: 41.3 % (ref 34.0–46.6)
Hemoglobin: 14 g/dL (ref 11.1–15.9)
MCH: 28.6 pg (ref 26.6–33.0)
MCHC: 33.9 g/dL (ref 31.5–35.7)
MCV: 85 fL (ref 79–97)
Platelets: 201 10*3/uL (ref 150–450)
RBC: 4.89 x10E6/uL (ref 3.77–5.28)
RDW: 13.3 % (ref 11.7–15.4)
WBC: 4.5 10*3/uL (ref 3.4–10.8)

## 2018-04-26 LAB — LIPID PANEL
Chol/HDL Ratio: 4.9 ratio — ABNORMAL HIGH (ref 0.0–4.4)
Cholesterol, Total: 205 mg/dL — ABNORMAL HIGH (ref 100–199)
HDL: 42 mg/dL (ref >39)
LDL Calculated: 141 mg/dL — ABNORMAL HIGH (ref 0–99)
TRIGLYCERIDES: 108 mg/dL (ref 0–149)
VLDL Cholesterol Cal: 22 mg/dL (ref 5–40)

## 2018-04-26 LAB — VITAMIN D 25 HYDROXY (VIT D DEFICIENCY, FRACTURES): VIT D 25 HYDROXY: 6.4 ng/mL — AB (ref 30.0–100.0)

## 2018-04-26 LAB — TSH: TSH: 2.86 u[IU]/mL (ref 0.450–4.500)

## 2018-04-26 MED ORDER — ATORVASTATIN CALCIUM 20 MG PO TABS: 20.0000 mg | ORAL_TABLET | Freq: Every day | ORAL | 0 refills | Status: DC

## 2018-04-26 MED ORDER — CHOLECALCIFEROL 1.25 MG (50000 UT) PO CAPS: 50000.0000 [IU] | ORAL_CAPSULE | ORAL | 0 refills | Status: AC

## 2018-06-17 ENCOUNTER — Telehealth: Payer: Self-pay | Admitting: Family Medicine

## 2018-06-30 ENCOUNTER — Telehealth: Payer: Self-pay | Admitting: Family Medicine

## 2018-06-30 ENCOUNTER — Encounter (INDEPENDENT_AMBULATORY_CARE_PROVIDER_SITE_OTHER): Payer: Self-pay

## 2018-06-30 ENCOUNTER — Other Ambulatory Visit: Payer: Self-pay

## 2018-07-01 ENCOUNTER — Encounter: Payer: Self-pay | Admitting: Family Medicine

## 2018-07-01 ENCOUNTER — Ambulatory Visit (INDEPENDENT_AMBULATORY_CARE_PROVIDER_SITE_OTHER): Payer: Medicare HMO | Admitting: Family Medicine

## 2018-07-01 VITALS — BP 151/81 | HR 73 | Temp 97.5°F | Ht 64.0 in | Wt 223.0 lb

## 2018-07-01 DIAGNOSIS — R1032 Left lower quadrant pain: Secondary | ICD-10-CM

## 2018-07-01 LAB — CBC WITH DIFFERENTIAL/PLATELET
Basophils Absolute: 0.1 10*3/uL (ref 0.0–0.2)
Basos: 2 %
EOS (ABSOLUTE): 0.1 10*3/uL (ref 0.0–0.4)
Eos: 2 %
Hematocrit: 41 % (ref 34.0–46.6)
Hemoglobin: 13.5 g/dL (ref 11.1–15.9)
Immature Grans (Abs): 0 10*3/uL (ref 0.0–0.1)
Immature Granulocytes: 0 %
Lymphocytes Absolute: 1.1 10*3/uL (ref 0.7–3.1)
Lymphs: 27 %
MCH: 28.1 pg (ref 26.6–33.0)
MCHC: 32.9 g/dL (ref 31.5–35.7)
MCV: 85 fL (ref 79–97)
Monocytes Absolute: 0.4 10*3/uL (ref 0.1–0.9)
Monocytes: 11 %
Neutrophils Absolute: 2.4 10*3/uL (ref 1.4–7.0)
Neutrophils: 58 %
Platelets: 194 10*3/uL (ref 150–450)
RBC: 4.8 x10E6/uL (ref 3.77–5.28)
RDW: 13.5 % (ref 11.7–15.4)
WBC: 4.1 10*3/uL (ref 3.4–10.8)

## 2018-07-01 LAB — MICROSCOPIC EXAMINATION
Epithelial Cells (non renal): >10 /hpf — AB (ref 0–10)
Renal Epithel, UA: NONE SEEN /hpf

## 2018-07-01 LAB — URINALYSIS, COMPLETE
Bilirubin, UA: NEGATIVE
Glucose, UA: NEGATIVE
Ketones, UA: NEGATIVE
Leukocytes,UA: NEGATIVE
Nitrite, UA: NEGATIVE
Protein,UA: NEGATIVE
RBC, UA: NEGATIVE
Specific Gravity, UA: 1.02 (ref 1.005–1.030)
Urobilinogen, Ur: 0.2 mg/dL (ref 0.2–1.0)
pH, UA: 7 (ref 5.0–7.5)

## 2018-07-01 NOTE — Patient Instructions (Signed)

## 2018-07-01 NOTE — Progress Notes (Signed)
Subjective:  Patient ID: Jodi Allen, female    DOB: 06-01-1940, 78 y.o.   MRN: [id number]  Chief Complaint:  left side pain   HPI: Jodi Allen is a 78 y.o. female presenting on 07/01/2018 for left side pain   1. LLQ abdominal pain   Pt presents today for left lower abdominal pain. States this pain started and resolved yesterday after eating a tomato. Pt states the pain was sharp and lasted about 2 hours. She denies pain at this time. No fever, chills, nausea, vomiting, diarrhea, constipation, hematochezia, or melena. No confusion or weakness. No flank pain, vaginal symptoms, or urinary symptoms.    Relevant past medical, surgical, family, and social history reviewed and updated as indicated.  Allergies and medications reviewed and updated.   Past Medical History:  Diagnosis Date  . GERD (gastroesophageal reflux disease)   . Osteopenia   . Osteoporosis     Past Surgical History:  Procedure Laterality Date  . ABDOMINAL HYSTERECTOMY      Social History   Socioeconomic History  . Marital status: Married    Spouse name: Not on file  . Number of children: 6  . Years of education: 44  . Highest education level: 11th grade  Occupational History  . Occupation: retired    Fish farm manager: [employer]  Social Needs  . Financial resource strain: Somewhat hard  . Food insecurity:    Worry: Never true    Inability: Never true  . Transportation needs:    Medical: No    Non-medical: No  Tobacco Use  . Smoking status: Never Smoker  . Smokeless tobacco: Never Used  Substance and Sexual Activity  . Alcohol use: No  . Drug use: No  . Sexual activity: Yes  Lifestyle  . Physical activity:    Days per week: 2 days    Minutes per session: 120 min  . Stress: Only a little  Relationships  . Social connections:    Talks on phone: More than three times a week    Gets together: More than three times a week    Attends religious service: More than 4 times per year    Active  member of club or organization: Yes    Attends meetings of clubs or organizations: More than 4 times per year    Relationship status: Married  . Intimate partner violence:    Fear of current or ex partner: No    Emotionally abused: No    Physically abused: No    Forced sexual activity: No  Other Topics Concern  . Not on file  Social History Narrative  . Not on file    Outpatient Encounter Medications as of 07/01/2018  Medication Sig  . atorvastatin (LIPITOR) 20 MG tablet Take 1 tablet (20 mg total) by mouth daily.  Marland Kitchen omeprazole (PRILOSEC) 20 MG capsule Take 1 capsule (20 mg total) by mouth daily.  . Cholecalciferol 1.25 MG (50000 UT) capsule Take 1 capsule (50,000 Units total) by mouth every 7 (seven) days for 12 doses. (Patient not taking: Reported on 07/01/2018)   No facility-administered encounter medications on file as of 07/01/2018.     No Known Allergies  Review of Systems  Constitutional: Negative for activity change, appetite change, chills, diaphoresis, fatigue, fever and unexpected weight change.  Respiratory: Negative for cough and shortness of breath.   Cardiovascular: Negative for chest pain, palpitations and leg swelling.  Gastrointestinal: Positive for abdominal pain (LLQ). Negative for abdominal distention, anal bleeding,  blood in stool, constipation, diarrhea, nausea, rectal pain and vomiting.  Genitourinary: Negative for decreased urine volume, difficulty urinating, dysuria, flank pain, frequency, pelvic pain, urgency, vaginal bleeding, vaginal discharge and vaginal pain.  Neurological: Negative for dizziness, weakness and headaches.  Psychiatric/Behavioral: Negative for confusion.  All other systems reviewed and are negative.       Objective:  BP (!) 151/81   Pulse 73   Temp (!) 97.5 F (36.4 C) (Oral)   Ht '5\' 4"'$  (1.626 m)   Wt 223 lb (101.2 kg)   BMI 38.28 kg/m    Wt Readings from Last 3 Encounters:  07/01/18 223 lb (101.2 kg)  04/25/18 221 lb (100.2  kg)  05/20/17 216 lb 9.6 oz (98.2 kg)    Physical Exam Vitals signs and nursing note reviewed.  Constitutional:      General: She is not in acute distress.    Appearance: Normal appearance. She is well-developed and well-groomed. She is obese. She is not ill-appearing, toxic-appearing or diaphoretic.  HENT:     Head: Normocephalic and atraumatic.     Mouth/Throat:     Mouth: Mucous membranes are moist.     Pharynx: Oropharynx is clear.  Eyes:     General: Lids are normal.     Conjunctiva/sclera: Conjunctivae normal.     Pupils: Pupils are equal, round, and reactive to light.  Neck:     Musculoskeletal: Neck supple.  Cardiovascular:     Rate and Rhythm: Normal rate and regular rhythm.     Heart sounds: Normal heart sounds. No murmur. No friction rub. No gallop.   Pulmonary:     Effort: Pulmonary effort is normal. No respiratory distress.     Breath sounds: Normal breath sounds.  Abdominal:     General: Abdomen is protuberant. Bowel sounds are normal. There is no distension.     Palpations: Abdomen is soft. There is no hepatomegaly, splenomegaly or mass.     Tenderness: There is no abdominal tenderness. There is no right CVA tenderness, left CVA tenderness, guarding or rebound.     Hernia: No hernia is present.  Skin:    General: Skin is warm and dry.     Capillary Refill: Capillary refill takes less than 2 seconds.     Coloration: Skin is not pale.  Neurological:     General: No focal deficit present.     Mental Status: She is alert and oriented to person, place, and time.  Psychiatric:        Mood and Affect: Mood normal.        Behavior: Behavior normal. Behavior is cooperative.        Thought Content: Thought content normal.        Judgment: Judgment normal.     Results for orders placed or performed in visit on 04/25/18  CMP14+EGFR  Result Value Ref Range   Glucose 89 65 - 99 mg/dL   BUN 11 8 - 27 mg/dL   Creatinine, Ser 1.02 (H) 0.57 - 1.00 mg/dL   GFR calc non  Af Amer 54 (L) >59 mL/min/1.73   GFR calc Af Amer 62 >59 mL/min/1.73   BUN/Creatinine Ratio 11 (L) 12 - 28   Sodium 138 134 - 144 mmol/L   Potassium 4.5 3.5 - 5.2 mmol/L   Chloride 100 96 - 106 mmol/L   CO2 25 20 - 29 mmol/L   Calcium 9.4 8.7 - 10.3 mg/dL   Total Protein 7.2 6.0 - 8.5 g/dL   Albumin  4.2 3.7 - 4.7 g/dL   Globulin, Total 3.0 1.5 - 4.5 g/dL   Albumin/Globulin Ratio 1.4 1.2 - 2.2   Bilirubin Total 0.6 0.0 - 1.2 mg/dL   Alkaline Phosphatase 97 39 - 117 IU/L   AST 19 0 - 40 IU/L   ALT 13 0 - 32 IU/L  CBC  Result Value Ref Range   WBC 4.5 3.4 - 10.8 x10E3/uL   RBC 4.89 3.77 - 5.28 x10E6/uL   Hemoglobin 14.0 11.1 - 15.9 g/dL   Hematocrit 41.3 34.0 - 46.6 %   MCV 85 79 - 97 fL   MCH 28.6 26.6 - 33.0 pg   MCHC 33.9 31.5 - 35.7 g/dL   RDW 13.3 11.7 - 15.4 %   Platelets 201 150 - 450 x10E3/uL  TSH  Result Value Ref Range   TSH 2.860 0.450 - 4.500 uIU/mL  Lipid Panel  Result Value Ref Range   Cholesterol, Total 205 (H) 100 - 199 mg/dL   Triglycerides 108 0 - 149 mg/dL   HDL 42 >39 mg/dL   VLDL Cholesterol Cal 22 5 - 40 mg/dL   LDL Calculated 141 (H) 0 - 99 mg/dL   Chol/HDL Ratio 4.9 (H) 0.0 - 4.4 ratio  Bayer DCA Hb A1c Waived  Result Value Ref Range   HB A1C (BAYER DCA - WAIVED) 5.2 <7.0 %  VITAMIN D 25 Hydroxy (Vit-D Deficiency, Fractures)  Result Value Ref Range   Vit D, 25-Hydroxy 6.4 (L) 30.0 - 100.0 ng/mL     Clean catch urinalysis unremarkable in office. Dip was negative, few bacteria and epithelial cells on microscopic exam.    Pertinent labs & imaging results that were available during my care of the patient were reviewed by me and considered in my medical decision making.  Assessment & Plan:  Jodi Allen was seen today for left side pain.  Diagnoses and all orders for this visit:  LLQ abdominal pain Symptoms resolved by time of visit. Will check urine and CBC. Report any return of symptoms, new or worsening symptoms. Pt aware of signs and symptoms  that warrant emergent evaluation.  -     CBC with Differential/Platelet -     Urinalysis, Complete     Continue all other maintenance medications.  Follow up plan: Return if symptoms worsen or fail to improve.  Educational handout given for abdominal pain  The above assessment and management plan was discussed with the patient. The patient verbalized understanding of and has agreed to the management plan. Patient is aware to call the clinic if symptoms persist or worsen. Patient is aware when to return to the clinic for a follow-up visit. Patient educated on when it is appropriate to go to the emergency department.   Monia Pouch, FNP-C [facility] [phone]

## 2018-09-07 ENCOUNTER — Telehealth: Payer: Self-pay | Admitting: Family Medicine

## 2018-09-23 ENCOUNTER — Ambulatory Visit (INDEPENDENT_AMBULATORY_CARE_PROVIDER_SITE_OTHER): Payer: Medicare HMO | Admitting: *Deleted

## 2018-09-23 DIAGNOSIS — Z0000 Encounter for general adult medical examination without abnormal findings: Secondary | ICD-10-CM | POA: Diagnosis not present

## 2018-09-23 NOTE — Progress Notes (Signed)
MEDICARE ANNUAL WELLNESS VISIT  09/23/2018  Telephone Visit Disclaimer This Medicare AWV was conducted by telephone due to national recommendations for restrictions regarding the COVID-19 Pandemic (e.g. social distancing).  I verified, using two identifiers, that I am speaking with Jodi Allen or their authorized healthcare agent. I discussed the limitations, risks, security, and privacy concerns of performing an evaluation and management service by telephone and the potential availability of an in-person appointment in the future. The patient expressed understanding and agreed to proceed.   Subjective:  Jodi Allen is a 78 y.o. female patient of Jodi Allen, Jodi M, DO who had a Medicare Annual Wellness Visit today via telephone. Jodi Allen is Retired and lives with their spouse. she has 6 children. she reports that she is socially active and does interact with friends/family regularly. she is minimally physically active and enjoys reading, watching TV and doing word search puzzles.  Patient Care Team: Jodi Allen, Jodi M, DO as PCP - General (Family Medicine)  Advanced Directives 09/23/2018  Does Patient Have a Medical Advance Directive? No  Would patient like information on creating a medical advance directive? No - Patient declined    Hospital Utilization Over the Past 12 Months: # of hospitalizations or ER visits: 0 # of surgeries: 0  Review of Systems    Patient reports that her overall health is unchanged compared to last year.  Patient Reported Readings (BP, Pulse, CBG, Weight, etc) none  Review of Systems: History obtained from chart review  All other systems negative.  Pain Assessment Pain : No/denies pain     Current Medications & Allergies (verified) Allergies as of 09/23/2018   No Known Allergies     Medication List       Accurate as of September 23, 2018  8:49 AM. If you have any questions, ask your nurse or doctor.        STOP taking  these medications   atorvastatin 20 MG tablet Commonly known as: LIPITOR     TAKE these medications   omeprazole 20 MG capsule Commonly known as: PRILOSEC Take 1 capsule (20 mg total) by mouth daily.       History (reviewed): Past Medical History:  Diagnosis Date  . GERD (gastroesophageal reflux disease)   . Osteopenia   . Osteoporosis    Past Surgical History:  Procedure Laterality Date  . ABDOMINAL HYSTERECTOMY     Family History  Problem Relation Age of Onset  . Cancer Mother        BREAST REMOVED  . Leukemia Sister   . Bladder Cancer Brother   . Arthritis Brother    Social History   Socioeconomic History  . Marital status: Married    Spouse name: Jodi Allen  . Number of children: 6  . Years of education: 6111  . Highest education level: 11th grade  Occupational History  . Occupation: retired    Associate Professormployer: [employer]  Social Needs  . Financial resource strain: Somewhat hard  . Food insecurity    Worry: Never true    Inability: Never true  . Transportation needs    Medical: No    Non-medical: No  Tobacco Use  . Smoking status: Never Smoker  . Smokeless tobacco: Never Used  Substance and Sexual Activity  . Alcohol use: No  . Drug use: No  . Sexual activity: Yes  Lifestyle  . Physical activity    Days per week: 3 days    Minutes per session: 30 min  . Stress:  Only a little  Relationships  . Social connections    Talks on phone: More than three times a week    Gets together: More than three times a week    Attends religious service: More than 4 times per year    Active member of club or organization: Yes    Attends meetings of clubs or organizations: More than 4 times per year    Relationship status: Married  Other Topics Concern  . Not on file  Social History Narrative  . Not on file    Activities of Daily Living In your present state of health, do you have any difficulty performing the following activities: 09/23/2018  Hearing? N  Vision? Y   Comment has blurry vision-needs to schedule eye exam appt  Difficulty concentrating or making decisions? N  Walking or climbing stairs? N  Dressing or bathing? N  Doing errands, shopping? N  Preparing Food and eating ? N  Using the Toilet? N  In the past six months, have you accidently leaked urine? Y  Comment rarely if she coughs or sneezes  Do you have problems with loss of bowel control? N  Managing your Medications? N  Managing your Finances? N  Housekeeping or managing your Housekeeping? N  Some recent data might be hidden    Patient Education/ Literacy How often do you need to have someone help you when you read instructions, pamphlets, or other written materials from your doctor or pharmacy?: 2 - Rarely What is the last grade level you completed in school?: 11th grade  Exercise Current Exercise Habits: Home exercise routine, Type of exercise: walking, Time (Minutes): 30, Frequency (Times/Week): 3, Weekly Exercise (Minutes/Week): 90, Intensity: Mild, Exercise limited by: None identified  Diet Patient reports consuming 2 meals a day and 1 snack(s) a day Patient reports that her primary diet is: Regular Patient reports that she does have regular access to food.   Depression Screen PHQ 2/9 Scores 09/23/2018 07/01/2018 04/25/2018 05/20/2017 12/30/2016 10/09/2016 07/22/2016  PHQ - 2 Score 0 0 0 0 0 0 0  PHQ- 9 Score - - 0 - - - -     Fall Risk Fall Risk  09/23/2018 07/01/2018 04/25/2018 05/20/2017 12/30/2016  Falls in the past year? 0 0 0 No No     Objective:  [name] seemed alert and oriented and she participated appropriately during our telephone visit.  Blood Pressure Weight BMI  BP Readings from Last 3 Encounters:  07/01/18 (!) 151/81  04/25/18 (!) 146/78  05/20/17 131/77   Wt Readings from Last 3 Encounters:  07/01/18 223 lb (101.2 kg)  04/25/18 221 lb (100.2 kg)  05/20/17 216 lb 9.6 oz (98.2 kg)   BMI Readings from Last 1 Encounters:  07/01/18 38.28 kg/Allen     *Unable to obtain current vital signs, weight, and BMI due to telephone visit type  Hearing/Vision  . Kaylee did not seem to have difficulty with hearing/understanding during the telephone conversation . Reports that she has not had a formal eye exam by an eye care professional within the past year . Reports that she has not had a formal hearing evaluation within the past year *Unable to fully assess hearing and vision during telephone visit type  Cognitive Function: 6CIT Screen 09/23/2018  What Year? 0 points  What month? 0 points  What time? 0 points  Count back from 20 0 points  Months in reverse 0 points  Repeat phrase 0 points  Total Score 0   (  Normal:0-7, Significant for Dysfunction: >8)  Normal Cognitive Function Screening: Yes   Immunization & Health Maintenance Record Immunization History  Administered Date(s) Administered  . Influenza, Seasonal, Injecte, Preservative Fre 12/03/2012  . Influenza,inj,Quad PF,6+ Mos 03/10/2016  . Influenza-Unspecified 12/03/2012  . Pneumococcal Conjugate-13 04/14/2017  . Tdap 06/27/2015    Health Maintenance  Topic Date Due  . PNA vac Low Risk Adult (2 of 2 - PPSV23) 04/15/2018  . INFLUENZA VACCINE  09/03/2018  . TETANUS/TDAP  06/26/2025  . DEXA SCAN  Completed       Assessment  This is a routine wellness examination for Jodi MelenaGeraldine Allen Ware.  Health Maintenance: Due or Overdue Health Maintenance Due  Topic Date Due  . PNA vac Low Risk Adult (2 of 2 - PPSV23) 04/15/2018  . INFLUENZA VACCINE  09/03/2018    Jodi MelenaGeraldine Allen Dirusso does not need a referral for Community Assistance: Care Management:   no Social Work:    no Prescription Assistance:  no Nutrition/Diabetes Education:  no   Plan:  Personalized Goals Goals Addressed            This Visit's Progress   . DIET - INCREASE WATER INTAKE       Try to drink 6-8 glasses of water daily.      Personalized Health Maintenance & Screening Recommendations   Pneumococcal vaccine  Influenza vaccine Shingles vaccine  Lung Cancer Screening Recommended: no (Low Dose CT Chest recommended if Age 2-80 years, 30 pack-year currently smoking OR have quit w/in past 15 years) Hepatitis C Screening recommended: no HIV Screening recommended: no  Advanced Directives: Written information was not prepared per patient's request.  Referrals & Orders No orders of the defined types were placed in this encounter.   Follow-up Plan . Follow-up with Jodi Allen, Jodi M, DO as planned . Consider Pneumovax23, Flu and Shingles vaccines at your next visit with your PCP   I have personally reviewed and noted the following in the patient's chart:   . Medical and social history . Use of alcohol, tobacco or illicit drugs  . Current medications and supplements . Functional ability and status . Nutritional status . Physical activity . Advanced directives . List of other physicians . Hospitalizations, surgeries, and ER visits in previous 12 months . Vitals . Screenings to include cognitive, depression, and falls . Referrals and appointments  In addition, I have reviewed and discussed with Jodi MelenaGeraldine Allen Kuklinski certain preventive protocols, quality metrics, and best practice recommendations. A written personalized care plan for preventive services as well as general preventive health recommendations is available and can be mailed to the patient at her request.      Margurite AuerbachCompton, Star Cheese G, LPN  5/62/13088/21/2020

## 2018-09-23 NOTE — Patient Instructions (Signed)
Preventive Care 38 Years and Older, Female Preventive care refers to lifestyle choices and visits with your health care provider that can promote health and wellness. This includes:  A yearly physical exam. This is also called an annual well check.  Regular dental and eye exams.  Immunizations.  Screening for certain conditions.  Healthy lifestyle choices, such as diet and exercise. What can I expect for my preventive care visit? Physical exam Your health care provider will check:  Height and weight. These may be used to calculate body mass index (BMI), which is a measurement that tells if you are at a healthy weight.  Heart rate and blood pressure.  Your skin for abnormal spots. Counseling Your health care provider may ask you questions about:  Alcohol, tobacco, and drug use.  Emotional well-being.  Home and relationship well-being.  Sexual activity.  Eating habits.  History of falls.  Memory and ability to understand (cognition).  Work and work Statistician.  Pregnancy and menstrual history. What immunizations do I need?  Influenza (flu) vaccine  This is recommended every year. Tetanus, diphtheria, and pertussis (Tdap) vaccine  You may need a Td booster every 10 years. Varicella (chickenpox) vaccine  You may need this vaccine if you have not already been vaccinated. Zoster (shingles) vaccine  You may need this after age 33. Pneumococcal conjugate (PCV13) vaccine  One dose is recommended after age 33. Pneumococcal polysaccharide (PPSV23) vaccine  One dose is recommended after age 72. Measles, mumps, and rubella (MMR) vaccine  You may need at least one dose of MMR if you were born in 1957 or later. You may also need a second dose. Meningococcal conjugate (MenACWY) vaccine  You may need this if you have certain conditions. Hepatitis A vaccine  You may need this if you have certain conditions or if you travel or work in places where you may be exposed  to hepatitis A. Hepatitis B vaccine  You may need this if you have certain conditions or if you travel or work in places where you may be exposed to hepatitis B. Haemophilus influenzae type b (Hib) vaccine  You may need this if you have certain conditions. You may receive vaccines as individual doses or as more than one vaccine together in one shot (combination vaccines). Talk with your health care provider about the risks and benefits of combination vaccines. What tests do I need? Blood tests  Lipid and cholesterol levels. These may be checked every 5 years, or more frequently depending on your overall health.  Hepatitis C test.  Hepatitis B test. Screening  Lung cancer screening. You may have this screening every year starting at age 39 if you have a 30-pack-year history of smoking and currently smoke or have quit within the past 15 years.  Colorectal cancer screening. All adults should have this screening starting at age 36 and continuing until age 15. Your health care provider may recommend screening at age 23 if you are at increased risk. You will have tests every 1-10 years, depending on your results and the type of screening test.  Diabetes screening. This is done by checking your blood sugar (glucose) after you have not eaten for a while (fasting). You may have this done every 1-3 years.  Mammogram. This may be done every 1-2 years. Talk with your health care provider about how often you should have regular mammograms.  BRCA-related cancer screening. This may be done if you have a family history of breast, ovarian, tubal, or peritoneal cancers.  Other tests  Sexually transmitted disease (STD) testing.  Bone density scan. This is done to screen for osteoporosis. You may have this done starting at age 76. Follow these instructions at home: Eating and drinking  Eat a diet that includes fresh fruits and vegetables, whole grains, lean protein, and low-fat dairy products. Limit  your intake of foods with high amounts of sugar, saturated fats, and salt.  Take vitamin and mineral supplements as recommended by your health care provider.  Do not drink alcohol if your health care provider tells you not to drink.  If you drink alcohol: ? Limit how much you have to 0-1 drink a day. ? Be aware of how much alcohol is in your drink. In the [place] one drink equals one 12 oz bottle of beer (355 mL), one 5 oz glass of wine (148 mL), or one 1 oz glass of hard liquor (44 mL). Lifestyle  Take daily care of your teeth and gums.  Stay active. Exercise for at least 30 minutes on 5 or more days each week.  Do not use any products that contain nicotine or tobacco, such as cigarettes, e-cigarettes, and chewing tobacco. If you need help quitting, ask your health care provider.  If you are sexually active, practice safe sex. Use a condom or other form of protection in order to prevent STIs (sexually transmitted infections).  Talk with your health care provider about taking a low-dose aspirin or statin. What's next?  Go to your health care provider once a year for a well check visit.  Ask your health care provider how often you should have your eyes and teeth checked.  Stay up to date on all vaccines. This information is not intended to replace advice given to you by your health care provider. Make sure you discuss any questions you have with your health care provider. Document Released: 02/15/2015 Document Revised: 01/13/2018 Document Reviewed: 01/13/2018 Elsevier Patient Education  2020 [employer].

## 2018-10-07 ENCOUNTER — Telehealth: Payer: Self-pay | Admitting: Family Medicine

## 2018-10-11 NOTE — Telephone Encounter (Signed)
appt scheduled Pt notified 

## 2018-10-13 ENCOUNTER — Other Ambulatory Visit: Payer: Self-pay

## 2018-10-14 ENCOUNTER — Ambulatory Visit (INDEPENDENT_AMBULATORY_CARE_PROVIDER_SITE_OTHER): Payer: Medicare HMO | Admitting: Family Medicine

## 2018-10-14 ENCOUNTER — Encounter: Payer: Self-pay | Admitting: Family Medicine

## 2018-10-14 VITALS — BP 148/79 | HR 79 | Temp 97.5°F | Resp 20 | Ht 64.0 in | Wt 224.0 lb

## 2018-10-14 DIAGNOSIS — R350 Frequency of micturition: Secondary | ICD-10-CM | POA: Diagnosis not present

## 2018-10-14 DIAGNOSIS — R1032 Left lower quadrant pain: Secondary | ICD-10-CM

## 2018-10-14 DIAGNOSIS — N3000 Acute cystitis without hematuria: Secondary | ICD-10-CM

## 2018-10-14 LAB — URINALYSIS, COMPLETE
Bilirubin, UA: NEGATIVE
Glucose, UA: NEGATIVE
Leukocytes,UA: NEGATIVE
Nitrite, UA: NEGATIVE
Protein,UA: NEGATIVE
RBC, UA: NEGATIVE
Specific Gravity, UA: 1.025 (ref 1.005–1.030)
Urobilinogen, Ur: 0.2 mg/dL (ref 0.2–1.0)
pH, UA: 5.5 (ref 5.0–7.5)

## 2018-10-14 LAB — MICROSCOPIC EXAMINATION
RBC, Urine: NONE SEEN /hpf (ref 0–2)
Renal Epithel, UA: NONE SEEN /hpf

## 2018-10-14 MED ORDER — CEPHALEXIN 500 MG PO CAPS: 500.0000 mg | ORAL_CAPSULE | Freq: Two times a day (BID) | ORAL | 0 refills | Status: AC

## 2018-10-14 NOTE — Patient Instructions (Signed)
Pyelonephritis, Adult ° °Pyelonephritis is an infection that occurs in the kidney. The kidneys are organs that help clean the blood by moving waste out of the blood and into the pee (urine). This infection can happen quickly, or it can last for a long time. In most cases, it clears up with treatment and does not cause other problems. °What are the causes? °This condition may be caused by: °· Germs (bacteria) going from the bladder up to the kidney. This may happen after having a bladder infection. °· Germs going from the blood to the kidney. °What increases the risk? °This condition is more likely to develop in: °· Pregnant women. °· Older people. °· People who have any of these conditions: °? Diabetes. °? Inflammation of the prostate gland (prostatitis), in males. °? Kidney stones or bladder stones. °? Other problems with the kidney or the parts of your body that carry pee from the kidneys to the bladder (ureters). °? Cancer. °· People who have a small, thin tube (catheter) placed in the bladder. °· People who are sexually active. °· Women who use a medicine that kills sperm (spermicide) to prevent pregnancy. °· People who have had a prior urinary tract infection (UTI). °What are the signs or symptoms? °Symptoms of this condition include: °· Peeing often. °· A strong urge to pee right away. °· Burning or stinging when peeing. °· Belly pain. °· Back pain. °· Pain in the side (flank area). °· Fever or chills. °· Blood in the pee, or dark pee. °· Feeling sick to your stomach (nauseous) or throwing up (vomiting). °How is this treated? °This condition may be treated by: °· Taking antibiotic medicines by mouth (orally). °· Drinking enough fluids. °If the infection is bad, you may need to stay in the hospital. You may be given antibiotics and fluids that are put directly into a vein through an IV tube. °In some cases, other treatments may be needed. °Follow these instructions at home: °Medicines °· Take your antibiotic  medicine as told by your doctor. Do not stop taking the antibiotic even if you start to feel better. °· Take over-the-counter and prescription medicines only as told by your doctor. °General instructions ° °· Drink enough fluid to keep your pee pale yellow. °· Avoid caffeine, tea, and carbonated drinks. °· Pee (urinate) often. Avoid holding in pee for long periods of time. °· Pee before and after sex. °· After pooping (having a bowel movement), women should wipe from front to back. Use each tissue only once. °· Keep all follow-up visits as told by your doctor. This is important. °Contact a doctor if: °· You do not feel better after 2 days. °· Your symptoms get worse. °· You have a fever. °Get help right away if: °· You cannot take your medicine or drink fluids as told. °· You have chills and shaking. °· You throw up. °· You have very bad pain in your side or back. °· You feel very weak or you pass out (faint). °Summary °· Pyelonephritis is an infection that occurs in the kidney. °· In most cases, this infection clears up with treatment and does not cause other problems. °· Take your antibiotic medicine as told by your doctor. Do not stop taking the antibiotic even if you start to feel better. °· Drink enough fluid to keep your pee pale yellow. °This information is not intended to replace advice given to you by your health care provider. Make sure you discuss any questions you have with   your health care provider. °Document Released: 02/27/2004 Document Revised: 11/23/2017 Document Reviewed: 11/23/2017 °Elsevier Patient Education © 2020 Elsevier Inc. ° °

## 2018-10-14 NOTE — Progress Notes (Signed)
Subjective: CC: UTi PCP: Janora Norlander, DO Jodi Allen is a 78 y.o. female presenting to clinic today for:  1. Urinary symptoms Patient reports a several day h/o increased urinary frequency, left sided abdominal pain. No urgency, hematuria, fevers, chills, abdominal pain, nausea, vomiting, back pain, vaginal discharge.  Patient has used nothing for symptoms.  Patient denies a h/o frequent or recurrent UTIs.    ROS: Per HPI  No Known Allergies Past Medical History:  Diagnosis Date  . GERD (gastroesophageal reflux disease)   . Osteopenia   . Osteoporosis    No current outpatient medications on file. Social History   Socioeconomic History  . Marital status: Married    Spouse name: Nadara Mustard  . Number of children: 6  . Years of education: 81  . Highest education level: 11th grade  Occupational History  . Occupation: retired    Fish farm manager: [employer]  Social Needs  . Financial resource strain: Somewhat hard  . Food insecurity    Worry: Never true    Inability: Never true  . Transportation needs    Medical: No    Non-medical: No  Tobacco Use  . Smoking status: Never Smoker  . Smokeless tobacco: Never Used  Substance and Sexual Activity  . Alcohol use: No  . Drug use: No  . Sexual activity: Yes  Lifestyle  . Physical activity    Days per week: 3 days    Minutes per session: 30 min  . Stress: Only a little  Relationships  . Social connections    Talks on phone: More than three times a week    Gets together: More than three times a week    Attends religious service: More than 4 times per year    Active member of club or organization: Yes    Attends meetings of clubs or organizations: More than 4 times per year    Relationship status: Married  . Intimate partner violence    Fear of current or ex partner: No    Emotionally abused: No    Physically abused: No    Forced sexual activity: No  Other Topics Concern  . Not on file  Social History Narrative  .  Not on file   Family History  Problem Relation Age of Onset  . Cancer Mother        BREAST REMOVED  . Leukemia Sister   . Bladder Cancer Brother   . Arthritis Brother     Objective: Office vital signs reviewed. BP (!) 148/79   Pulse 79   Temp (!) 97.5 F (36.4 C)   Resp 20   Ht 5\' 4"  (1.626 m)   Wt 224 lb (101.6 kg)   SpO2 100%   BMI 38.45 kg/m   Physical Examination:  General: Awake, alert, well nourished, No acute distress GU: +suprapubic TTP, no CVA TTP  Assessment/ Plan: 78 y.o. female   1. Acute cystitis without hematuria Start Keflex twice daily.  I do not think she has overt pyelonephritis but I worry that the infection is trying to progress to pyelonephritis given left sided abdominal pain and some irritation along the left CVA but again no overt CVA tenderness.  She is afebrile and well-appearing.  We discussed pushing oral fluids.  Reasons for return discussed as well.  She will follow-up PRN - Urine Culture - Urinalysis, Complete - cephALEXin (KEFLEX) 500 MG capsule; Take 1 capsule (500 mg total) by mouth 2 (two) times daily for 7 days.  Dispense:  14 capsule; Refill: 0  2. LLQ abdominal pain - Urine Culture - Urinalysis, Complete - cephALEXin (KEFLEX) 500 MG capsule; Take 1 capsule (500 mg total) by mouth 2 (two) times daily for 7 days.  Dispense: 14 capsule; Refill: 0   Orders Placed This Encounter  Procedures  . Urine Culture  . Urinalysis, Complete   No orders of the defined types were placed in this encounter.    Raliegh IpAshly M Gottschalk, DO [facility] [phone]

## 2018-10-16 LAB — URINE CULTURE

## 2018-11-09 DIAGNOSIS — H35363 Drusen (degenerative) of macula, bilateral: Secondary | ICD-10-CM | POA: Diagnosis not present

## 2018-11-09 DIAGNOSIS — H5200 Hypermetropia, unspecified eye: Secondary | ICD-10-CM | POA: Diagnosis not present

## 2018-11-09 DIAGNOSIS — H43813 Vitreous degeneration, bilateral: Secondary | ICD-10-CM | POA: Diagnosis not present

## 2018-11-09 DIAGNOSIS — H2513 Age-related nuclear cataract, bilateral: Secondary | ICD-10-CM | POA: Diagnosis not present

## 2019-01-03 ENCOUNTER — Encounter: Payer: Self-pay | Admitting: Family Medicine

## 2019-01-03 ENCOUNTER — Ambulatory Visit (INDEPENDENT_AMBULATORY_CARE_PROVIDER_SITE_OTHER): Payer: Medicare HMO | Admitting: Family Medicine

## 2019-01-03 ENCOUNTER — Telehealth: Payer: Self-pay | Admitting: Family Medicine

## 2019-01-03 DIAGNOSIS — R300 Dysuria: Secondary | ICD-10-CM | POA: Diagnosis not present

## 2019-01-03 LAB — URINALYSIS, COMPLETE
Bilirubin, UA: NEGATIVE
Glucose, UA: NEGATIVE
Nitrite, UA: NEGATIVE
Protein,UA: NEGATIVE
RBC, UA: NEGATIVE
Specific Gravity, UA: 1.025 (ref 1.005–1.030)
Urobilinogen, Ur: 0.2 mg/dL (ref 0.2–1.0)
pH, UA: 7 (ref 5.0–7.5)

## 2019-01-03 LAB — MICROSCOPIC EXAMINATION
Epithelial Cells (non renal): >10 /hpf — AB (ref 0–10)
Renal Epithel, UA: NONE SEEN /hpf

## 2019-01-03 MED ORDER — CEPHALEXIN 500 MG PO CAPS: 500.0000 mg | ORAL_CAPSULE | Freq: Two times a day (BID) | ORAL | 0 refills | Status: DC

## 2019-01-03 NOTE — Progress Notes (Signed)
Telephone visit  Subjective: CC: UTI? PCP: Janora Norlander, DO UJW:[id number] M Paz is a 78 y.o. female calls for telephone consult today. Patient provides verbal consent for consult held via phone.  Location of patient: home Location of provider: [facility] Others present for call: none  1. Urinary symptoms Patient reports intermittent history of low back pain, dysuria, urinary frequency, urgency. No hematuria, fevers, chills, abdominal pain, nausea, vomiting, vaginal discharge.  No medications used except for the Keflex back in September, which she did feel helped.   ROS: Per HPI  No Known Allergies Past Medical History:  Diagnosis Date  . GERD (gastroesophageal reflux disease)   . Osteopenia   . Osteoporosis    No current outpatient medications on file.  Assessment/ Plan: 78 y.o. female   1. Dysuria Presumed acute cystitis.  Will empirically treat with Keflex p.o. twice daily.  Urine culture, urinalysis and micro also ordered.  Patient will come by today to leave urine sample.  We discussed may need to be seen by urology at some point given recurrent symptoms.  I reviewed her last urine culture which did not show significant growth. ?  Interstitial cystitis versus underlying degenerative back pain - cephALEXin (KEFLEX) 500 MG capsule; Take 1 capsule (500 mg total) by mouth 2 (two) times daily for 7 days.  Dispense: 14 capsule; Refill: 0 - urinalysis- dip and micro - Urine culture   Start time: 1:04pm End time: 1:09pm  Total time spent on patient care (including telephone call/ virtual visit): 10 minutes  [place], [place] [phone]

## 2019-01-05 LAB — URINE CULTURE

## 2019-01-10 ENCOUNTER — Other Ambulatory Visit: Payer: Self-pay | Admitting: Family Medicine

## 2019-01-10 MED ORDER — NITROFURANTOIN MONOHYD MACRO 100 MG PO CAPS: 100.0000 mg | ORAL_CAPSULE | Freq: Two times a day (BID) | ORAL | 0 refills | Status: AC

## 2019-02-28 ENCOUNTER — Ambulatory Visit (INDEPENDENT_AMBULATORY_CARE_PROVIDER_SITE_OTHER): Payer: Medicare HMO | Admitting: Family Medicine

## 2019-02-28 DIAGNOSIS — M5442 Lumbago with sciatica, left side: Secondary | ICD-10-CM

## 2019-02-28 DIAGNOSIS — G8929 Other chronic pain: Secondary | ICD-10-CM

## 2019-02-28 DIAGNOSIS — E782 Mixed hyperlipidemia: Secondary | ICD-10-CM

## 2019-02-28 DIAGNOSIS — M5441 Lumbago with sciatica, right side: Secondary | ICD-10-CM

## 2019-02-28 DIAGNOSIS — I10 Essential (primary) hypertension: Secondary | ICD-10-CM

## 2019-02-28 DIAGNOSIS — E559 Vitamin D deficiency, unspecified: Secondary | ICD-10-CM | POA: Diagnosis not present

## 2019-02-28 DIAGNOSIS — M8589 Other specified disorders of bone density and structure, multiple sites: Secondary | ICD-10-CM | POA: Diagnosis not present

## 2019-02-28 DIAGNOSIS — R739 Hyperglycemia, unspecified: Secondary | ICD-10-CM

## 2019-02-28 NOTE — Progress Notes (Signed)
Telephone visit  Subjective: CC: low back pain PCP: Janora Norlander, DO IWO:EHOZYYQMG M Mausolf is a 79 y.o. female calls for telephone consult today. Patient provides verbal consent for consult held via phone.  Due to COVID-19 pandemic this visit was conducted virtually. This visit type was conducted due to national recommendations for restrictions regarding the COVID-19 Pandemic (e.g. social distancing, sheltering in place) in an effort to limit this patient's exposure and mitigate transmission in our community. All issues noted in this document were discussed and addressed.  A physical exam was not performed with this format.   Location of patient: home Location of provider: Working remotely from home Others present for call: none  1. Low back pain She reports onset several months ago.  She has been using tylenol.  She feels mild numbness in her legs that comes and goes.  She reports occasional weakness of the legs.  No preceding injury.  No saddle anesthesia, fecal incontinence or urinary retention.  2.  Hypertension and hyperlipidemia Patient has had diet controlled high blood pressure.  Denies any chest pain, shortness of breath, lower extremity or falls.  Her ASCVD risk was noted to be 18.5% last year and a Lipitor was recommended but patient never started this.  Should come off of pravastatin several months prior for an unknown reason.  She wants to get labs done tomorrow.  3.  Osteopenia/vitamin D deficiency Patient is not on any OTC supplements for her bones.  She is had some low back pain as above.  Diet is fair.  ROS: Per HPI  No Known Allergies Past Medical History:  Diagnosis Date  . GERD (gastroesophageal reflux disease)   . Osteopenia   . Osteoporosis    No current outpatient medications on file.  Assessment/ Plan: 79 y.o. female   1. Chronic bilateral low back pain with bilateral sciatica Likely degenerative in nature.  No red flags.  Check lumbar x-ray.  May  need to consider NSAID and or physical therapy. - DG Lumbar Spine 2-3 Views; Future  2. Mixed hyperlipidemia She will come in for fasting labs tomorrow.  Anticipate need to start Lipitor 20 mg daily given previous ASCVD risk score. - CMP14+EGFR; Future - TSH; Future - Lipid panel; Future  3. Essential hypertension Would like her to have her blood pressure checked in office if possible.  She has up to this appointment diet controlled high blood pressure - CMP14+EGFR; Future  4. Osteopenia of multiple sites Due for bone density scan in March.  Check calcium levels - CMP14+EGFR; Future  5. Vitamin D deficiency We will likely need to get her back on high-dose vitamin D and then transition over to 800 international units of vitamin D OTC. - VITAMIN D 25 Hydroxy (Vit-D Deficiency, Fractures); Future  6. Hyperglycemia Not discussed but will collect labs at her visit tomorrow - Bayer DCA Hb A1c Waived; Future   Start time: 7:59am End time: 8:05am  Total time spent on patient care (including telephone call/ virtual visit): 17 minutes  [place], [place] [phone]

## 2019-03-01 ENCOUNTER — Other Ambulatory Visit: Payer: Medicare HMO

## 2019-03-01 ENCOUNTER — Other Ambulatory Visit: Payer: Self-pay

## 2019-03-01 DIAGNOSIS — E559 Vitamin D deficiency, unspecified: Secondary | ICD-10-CM | POA: Diagnosis not present

## 2019-03-01 DIAGNOSIS — E782 Mixed hyperlipidemia: Secondary | ICD-10-CM

## 2019-03-01 DIAGNOSIS — I10 Essential (primary) hypertension: Secondary | ICD-10-CM | POA: Diagnosis not present

## 2019-03-01 DIAGNOSIS — Z1231 Encounter for screening mammogram for malignant neoplasm of breast: Secondary | ICD-10-CM | POA: Diagnosis not present

## 2019-03-01 DIAGNOSIS — M8589 Other specified disorders of bone density and structure, multiple sites: Secondary | ICD-10-CM

## 2019-03-01 DIAGNOSIS — R739 Hyperglycemia, unspecified: Secondary | ICD-10-CM | POA: Diagnosis not present

## 2019-03-01 LAB — BAYER DCA HB A1C WAIVED: HB A1C (BAYER DCA - WAIVED): 5.4 % (ref <7.0)

## 2019-03-02 LAB — TSH: TSH: 2.92 u[IU]/mL (ref 0.450–4.500)

## 2019-03-02 LAB — CMP14+EGFR
ALT: 11 IU/L (ref 0–32)
AST: 23 IU/L (ref 0–40)
Albumin/Globulin Ratio: 1.5 (ref 1.2–2.2)
Albumin: 4.1 g/dL (ref 3.7–4.7)
Alkaline Phosphatase: 95 IU/L (ref 39–117)
BUN/Creatinine Ratio: 11 — ABNORMAL LOW (ref 12–28)
BUN: 11 mg/dL (ref 8–27)
Bilirubin Total: 0.4 mg/dL (ref 0.0–1.2)
CO2: 24 mmol/L (ref 20–29)
Calcium: 9.5 mg/dL (ref 8.7–10.3)
Chloride: 103 mmol/L (ref 96–106)
Creatinine, Ser: 0.98 mg/dL (ref 0.57–1.00)
GFR calc Af Amer: 65 mL/min/{1.73_m2} (ref >59)
GFR calc non Af Amer: 56 mL/min/{1.73_m2} — ABNORMAL LOW (ref >59)
Globulin, Total: 2.8 g/dL (ref 1.5–4.5)
Glucose: 91 mg/dL (ref 65–99)
Potassium: 4.4 mmol/L (ref 3.5–5.2)
Sodium: 139 mmol/L (ref 134–144)
Total Protein: 6.9 g/dL (ref 6.0–8.5)

## 2019-03-02 LAB — LIPID PANEL
Chol/HDL Ratio: 4 ratio (ref 0.0–4.4)
Cholesterol, Total: 183 mg/dL (ref 100–199)
HDL: 46 mg/dL (ref >39)
LDL Chol Calc (NIH): 119 mg/dL — ABNORMAL HIGH (ref 0–99)
Triglycerides: 98 mg/dL (ref 0–149)
VLDL Cholesterol Cal: 18 mg/dL (ref 5–40)

## 2019-03-02 LAB — VITAMIN D 25 HYDROXY (VIT D DEFICIENCY, FRACTURES): Vit D, 25-Hydroxy: 16 ng/mL — ABNORMAL LOW (ref 30.0–100.0)

## 2019-03-06 ENCOUNTER — Other Ambulatory Visit: Payer: Self-pay | Admitting: Family

## 2019-03-06 MED ORDER — ATORVASTATIN CALCIUM 20 MG PO TABS: 20.0000 mg | ORAL_TABLET | Freq: Every day | ORAL | 3 refills | Status: DC

## 2019-04-14 ENCOUNTER — Telehealth: Payer: Self-pay | Admitting: Family Medicine

## 2019-04-14 NOTE — Chronic Care Management (AMB) (Signed)
  Chronic Care Management   Outreach Note  04/14/2019 Name: Zarahi Fuerst Wandell MRN: [id number] DOB: 18-Dec-1940  Cicley Ganesh Mainor is a 79 y.o. year old female who is a primary care patient of Raliegh Ip, DO. I reached out to Ronn Melena Berlanga by phone today in response to a referral sent by Ms. Ronn Melena Schetter's health plan.     An unsuccessful telephone outreach was attempted today. The patient was referred to the case management team for assistance with care management and care coordination.   Follow Up Plan: The care management team will reach out to the patient again over the next 7 days. If patient returns call to provider office, please advise to call Embedded Care Management Care Guide Gwenevere Ghazi at [phone].  Gwenevere Ghazi  Care Guide, Embedded Care Coordination [facility]  [place], [place] [address] Direct Dial: [phone] [email or web address] Website: [email or web address]

## 2019-04-17 ENCOUNTER — Telehealth: Payer: Self-pay | Admitting: Family Medicine

## 2019-04-17 NOTE — Chronic Care Management (AMB) (Signed)
  Chronic Care Management   Note  04/17/2019 Name: Jodi Allen MRN: [id number] DOB: September 16, 1940  Jodi Allen is a 79 y.o. year old female who is a primary care patient of Janora Norlander, DO. I reached out to [place] by phone today in response to a referral sent by Jodi Allen's health plan.     [name] was given information about Chronic Care Management services today including:  1. CCM service includes personalized support from designated clinical staff supervised by her physician, including individualized plan of care and coordination with other care providers 2. 24/7 contact phone numbers for assistance for urgent and routine care needs. 3. Service will only be billed when office clinical staff spend 20 minutes or more in a month to coordinate care. 4. Only one practitioner may furnish and bill the service in a calendar month. 5. The patient may stop CCM services at any time (effective at the end of the month) by phone call to the office staff. 6. The patient will be responsible for cost sharing (co-pay) of up to 20% of the service fee (after annual deductible is met).  Patient agreed to services and verbal consent obtained.   Follow up plan: Telephone appointment with care management team member scheduled for: 07/27/2019.  [place], [place] [address] Direct Dial: [phone] [email or web address]: [email or web address]

## 2019-06-06 ENCOUNTER — Ambulatory Visit: Payer: Medicare HMO | Admitting: Family Medicine

## 2019-06-06 DIAGNOSIS — M1712 Unilateral primary osteoarthritis, left knee: Secondary | ICD-10-CM | POA: Diagnosis not present

## 2019-06-06 DIAGNOSIS — M25562 Pain in left knee: Secondary | ICD-10-CM | POA: Diagnosis not present

## 2019-07-13 ENCOUNTER — Other Ambulatory Visit: Payer: Self-pay | Admitting: *Deleted

## 2019-07-13 NOTE — Telephone Encounter (Signed)
Fax from [facility] for Naproxen 500 mg Not on med list, has not been on pt's med list. Fax sent back with this information

## 2019-07-19 ENCOUNTER — Other Ambulatory Visit: Payer: Self-pay

## 2019-07-19 ENCOUNTER — Encounter: Payer: Self-pay | Admitting: Family Medicine

## 2019-07-19 ENCOUNTER — Ambulatory Visit (INDEPENDENT_AMBULATORY_CARE_PROVIDER_SITE_OTHER): Payer: Medicare HMO | Admitting: Family Medicine

## 2019-07-19 VITALS — BP 138/75 | HR 72 | Temp 97.3°F | Ht 64.0 in | Wt 216.0 lb

## 2019-07-19 DIAGNOSIS — E782 Mixed hyperlipidemia: Secondary | ICD-10-CM | POA: Diagnosis not present

## 2019-07-19 DIAGNOSIS — M1712 Unilateral primary osteoarthritis, left knee: Secondary | ICD-10-CM

## 2019-07-19 DIAGNOSIS — Z1159 Encounter for screening for other viral diseases: Secondary | ICD-10-CM | POA: Diagnosis not present

## 2019-07-19 DIAGNOSIS — M25562 Pain in left knee: Secondary | ICD-10-CM | POA: Diagnosis not present

## 2019-07-19 DIAGNOSIS — E559 Vitamin D deficiency, unspecified: Secondary | ICD-10-CM

## 2019-07-19 MED ORDER — DICLOFENAC SODIUM 1 % EX GEL: 4.0000 g | Freq: Four times a day (QID) | CUTANEOUS | 2 refills | Status: DC

## 2019-07-19 NOTE — Patient Instructions (Signed)
Ok to use Tylenol for knee pain.  I've added Voltaren gel. You can use this 4 times daily if needed. If knee pain does not get better, see me for knee injection.

## 2019-07-19 NOTE — Progress Notes (Signed)
Subjective: CC: knee pain, Vit D follow up PCP: Raliegh Ip, DO Jodi Allen is a 79 y.o. female presenting to clinic today for:  1. Knee pain Patient was seen [facility] the [facility] on 5[facility] for left knee pain.  She notes that it tends to wax and wane.  She does feel that there is some occasional instability of the left knee.  She also reports intermittent swelling of the left knee.  Denies any preceding injury.  No falls.  No sensory changes.  She is been using Tylenol with some degree of relief.  She got her Covid vaccination and surprisingly her knee feels better today.  She had x-rays done there and she was told that she had arthritis in the knees.  2. vitamin D deficiency  Patient reports completion of vitamin D prescription but is never started any OTC vitamin D.  3.  Hyperlipidemia Patient reports once weekly use of the Lipitor.  No chest pain, shortness of breath.  ROS: Per HPI  No Known Allergies Past Medical History:  Diagnosis Date   GERD (gastroesophageal reflux disease)    Osteopenia    Osteoporosis     Current Outpatient Medications:    atorvastatin (LIPITOR) 20 MG tablet, Take 1 tablet (20 mg total) by mouth daily., Disp: 90 tablet, Rfl: 3 Social History   Socioeconomic History   Marital status: Married    Spouse name: Dimas Aguas   Number of children: 6   Years of education: 11   Highest education level: 11th grade  Occupational History   Occupation: retired    Associate Professor: [employer]  Tobacco Use   Smoking status: Never Smoker   Smokeless tobacco: Never Used  Building services engineer Use: Never used  Substance and Sexual Activity   Alcohol use: No   Drug use: No   Sexual activity: Yes  Other Topics Concern   Not on file  Social History Narrative   Not on file   Social Determinants of Health   Financial Resource Strain:    Difficulty of Paying Living Expenses:   Food Insecurity:    Worried About Programme researcher, broadcasting/film/video in the Last Year:     Barista in the Last Year:   Transportation Needs:    Freight forwarder (Medical):    Lack of Transportation (Non-Medical):   Physical Activity: Insufficiently Active   Days of Exercise per Week: 3 days   Minutes of Exercise per Session: 30 min  Stress:    Feeling of Stress :   Social Connections:    Frequency of Communication with Friends and Family:    Frequency of Social Gatherings with Friends and Family:    Attends Religious Services:    Active Member of Clubs or Organizations:    Attends Engineer, structural:    Marital Status:   Intimate Partner Violence:    Fear of Current or Ex-Partner:    Emotionally Abused:    Physically Abused:    Sexually Abused:    Family History  Problem Relation Age of Onset   Cancer Mother        BREAST REMOVED   Leukemia Sister    Bladder Cancer Brother    Arthritis Brother     Objective: Office vital signs reviewed. BP 138/75    Pulse 72    Temp (!) 97.3 F (36.3 C) (Temporal)    Ht 5\' 4"  (1.626 m)    Wt 216 lb (98 kg)  SpO2 97%    BMI 37.08 kg/m   Physical Examination:  General: Awake, alert, well nourished, obese. No acute distress HEENT: Normal, sclera white MSK: Stiff gait and station.  Left knee with no appreciable effusion, soft tissue swelling.  She has mild tenderness palpation over the medial joint line and posterior popliteal fossa.  No palpable abnormalities.  No ligamentous laxity. Neuro: Light touch sensation grossly intact   Assessment/ Plan: 79 y.o. female   1. Acute pain of left knee Topical Voltaren gel sent to pharmacy.  2. Vitamin D deficiency Check vitamin D level.  We discussed that she may need to repeat loading vitamin D and start OTC vitamin D - VITAMIN D 25 Hydroxy (Vit-D Deficiency, Fractures)  3. Encounter for hepatitis C screening test for low risk patient - Hepatitis C antibody  4. Mixed hyperlipidemia - Lipid Panel - Hepatic Function Panel  5.  Primary osteoarthritis of left knee - diclofenac Sodium (VOLTAREN) 1 % GEL; Apply 4 g topically 4 (four) times daily. (knee pain).  If not covered please show her where the OTC version is  Dispense: 400 g; Refill: 2   No orders of the defined types were placed in this encounter.  No orders of the defined types were placed in this encounter.    Janora Norlander, DO [place] [phone]

## 2019-07-20 LAB — HEPATIC FUNCTION PANEL
ALT: 15 IU/L (ref 0–32)
AST: 20 IU/L (ref 0–40)
Albumin: 4.1 g/dL (ref 3.7–4.7)
Alkaline Phosphatase: 92 IU/L (ref 48–121)
Bilirubin Total: 0.5 mg/dL (ref 0.0–1.2)
Bilirubin, Direct: 0.13 mg/dL (ref 0.00–0.40)
Total Protein: 7.2 g/dL (ref 6.0–8.5)

## 2019-07-20 LAB — LIPID PANEL
Chol/HDL Ratio: 4.1 ratio (ref 0.0–4.4)
Cholesterol, Total: 175 mg/dL (ref 100–199)
HDL: 43 mg/dL (ref >39)
LDL Chol Calc (NIH): 113 mg/dL — ABNORMAL HIGH (ref 0–99)
Triglycerides: 106 mg/dL (ref 0–149)
VLDL Cholesterol Cal: 19 mg/dL (ref 5–40)

## 2019-07-20 LAB — HEPATITIS C ANTIBODY: Hep C Virus Ab: <0.1 s/co ratio (ref 0.0–0.9)

## 2019-07-20 LAB — VITAMIN D 25 HYDROXY (VIT D DEFICIENCY, FRACTURES): Vit D, 25-Hydroxy: 19.7 ng/mL — ABNORMAL LOW (ref 30.0–100.0)

## 2019-07-21 ENCOUNTER — Other Ambulatory Visit: Payer: Self-pay | Admitting: Family Medicine

## 2019-07-21 MED ORDER — CHOLECALCIFEROL 1.25 MG (50000 UT) PO CAPS: 50000.0000 [IU] | ORAL_CAPSULE | ORAL | 0 refills | Status: AC

## 2019-07-27 ENCOUNTER — Telehealth: Payer: Self-pay | Admitting: Family Medicine

## 2019-07-27 ENCOUNTER — Ambulatory Visit: Payer: Medicare HMO | Admitting: *Deleted

## 2019-07-27 NOTE — Chronic Care Management (AMB) (Signed)
  Chronic Care Management   Note  07/27/2019 Name: Jodi Allen MRN: [id number] DOB: 1940-08-25  Jodi Allen is a 79 y.o. year old female who is a primary care patient of Raliegh Ip, DO and is actively engaged with the care management team. I reached out to Jodi Allen by phone today to assist with re-scheduling an initial visit with the RN Case Manager.  Follow up plan: Telephone appointment with care management team member scheduled for:09/06/2019.  Gwenevere Ghazi  Care Guide, Embedded Care Coordination [facility]  [place], [place] [address] Direct Dial: [phone] [email or web address] Website: [email or web address]

## 2019-07-27 NOTE — Chronic Care Management (AMB) (Signed)
  Chronic Care Management   Initial Outreach  07/27/2019 Name: Jodi Allen MRN: [id number] DOB: December 26, 1940  Referred by: Raliegh Ip, DO Reason for referral : Chronic Care Management (Initial Visit)   An unsuccessful telephone follow-up was attempted today. The patient was referred to the case management team for assistance with care management and care coordination.   RN Care Plan   .  Chronic Disease Management Needs (pt-stated)        CARE PLAN ENTRY (see longtitudinal plan of care for additional care plan information)  Current Barriers:  . Chronic Disease Management support, education, and care coordination needs related to HTN, osteoporosis, HLD  Clinical Goals: . Over the next 10 days, patient will be contacted by a Care Guide to reschedule their Initial CCM Visit . Over the next 30 days, patient will have an Initial CCM Visit with a member of the embedded CCM team to discuss self-management of their chronic medical conditions  Interventions: . Chart reviewed in preparation for initial visit telephone call . Collaboration with other care team members as needed . Unsuccessful outreach to patient  . Request sent to care guides to reach out and reschedule patient's initial visit  Patient Self Care Activities: . Undetermined   Initial goal documentation        Follow Up Plan: The care management team will reach out to the patient again over the next 10 days.   Demetrios Loll, BSN, RN-BC Embedded Chronic Care Manager [facility] Care Management Direct Dial: [phone]

## 2019-07-27 NOTE — Chronic Care Management (AMB) (Signed)
  Chronic Care Management   Note  07/27/2019 Name: Jodi Allen MRN: [id number] DOB: 01/27/1941  Jodi Allen is a 79 y.o. year old female who is a primary care patient of Raliegh Ip, DO and is actively engaged with the care management team. I reached out to Jodi Allen by phone today to assist with re-scheduling an initial visit with the RN Case Manager  Follow up plan: Unsuccessful telephone outreach attempt made. A HIPPA compliant phone message was left for the patient providing contact information and requesting a return call. The care management team will reach out to the patient again over the next 7 days. If patient returns call to provider office, please advise to call Embedded Care Management Care Guide Gwenevere Ghazi at [phone].  Gwenevere Ghazi  Care Guide, Embedded Care Coordination [facility]  [place], [place] [address] Direct Dial: [phone] [email or web address] Website: [email or web address]

## 2019-09-06 ENCOUNTER — Telehealth: Payer: Medicare HMO | Admitting: *Deleted

## 2019-09-06 ENCOUNTER — Telehealth: Payer: Self-pay | Admitting: *Deleted

## 2019-09-06 NOTE — Telephone Encounter (Signed)
  Chronic Care Management   Outreach Note  09/06/2019 Name: Jodi Allen MRN: [id number] DOB: Dec 13, 1940  Referred by: Raliegh Ip, DO Reason for referral : Chronic Care Management (Initial Visit Outreach)   A 2nd unsuccessful Initial Telephone Visit outreach was attempted today. The patient was referred to the case management team for assistance with care management and care coordination.   Clinical Goals: . Over the next 10 days, patient will be contacted by a Care Guide to reschedule their Initial CCM Visit . Over the next 30 days, patient will have an Initial CCM Visit with a member of the embedded CCM team to discuss self-management of their chronic medical conditions  Interventions and Plan . Chart reviewed in preparation for initial visit telephone call . Collaboration with other care team members as needed . Unsuccessful outreach to patient  . Request sent to care guides to reach out and reschedule patient's initial visit   Demetrios Loll, BSN, RN-BC Embedded Chronic Care Manager [facility] Care Management Direct Dial: [phone]

## 2019-09-19 NOTE — Telephone Encounter (Signed)
Pt has been r/s for 11/06/2019

## 2019-10-06 ENCOUNTER — Other Ambulatory Visit: Payer: Self-pay

## 2019-10-06 DIAGNOSIS — E559 Vitamin D deficiency, unspecified: Secondary | ICD-10-CM

## 2019-10-06 DIAGNOSIS — M8589 Other specified disorders of bone density and structure, multiple sites: Secondary | ICD-10-CM

## 2019-10-06 DIAGNOSIS — Z780 Asymptomatic menopausal state: Secondary | ICD-10-CM

## 2019-11-06 ENCOUNTER — Ambulatory Visit (INDEPENDENT_AMBULATORY_CARE_PROVIDER_SITE_OTHER): Payer: Medicare HMO | Admitting: *Deleted

## 2019-11-06 DIAGNOSIS — I10 Essential (primary) hypertension: Secondary | ICD-10-CM | POA: Diagnosis not present

## 2019-11-06 DIAGNOSIS — E782 Mixed hyperlipidemia: Secondary | ICD-10-CM | POA: Diagnosis not present

## 2019-11-08 NOTE — Chronic Care Management (AMB) (Signed)
Chronic Care Management   Initial Visit Note  11/06/2019 Name: Jodi Allen MRN: [id number] DOB: [birth year]  Referred by: Raliegh Ip, DO Reason for referral : Chronic Care Management (Initial Visit)   Jodi Allen is a 79 y.o. year old female who is a primary care patient of Raliegh Ip, DO. The CCM team was consulted for assistance with chronic disease management and care coordination needs related to HTN, osteoporosis, HLD, food insecurity.  Review of patient status, including review of consultants reports, relevant laboratory and other test results, and collaboration with appropriate care team members and the patient's provider was performed as part of comprehensive patient evaluation and provision of chronic care management services.    Subjective: I spoke with Jodi Allen regarding management of her chronic medical conditions.   SDOH (Social Determinants of Health) assessments performed: Yes See Care Plan activities for detailed interventions related to SDOH     Objective: Outpatient Encounter Medications as of 11/06/2019  Medication Sig  . atorvastatin (LIPITOR) 20 MG tablet Take 1 tablet (20 mg total) by mouth daily. (Patient not taking: Reported on 07/19/2019)  . diclofenac Sodium (VOLTAREN) 1 % GEL Apply 4 g topically 4 (four) times daily. (knee pain).  If not covered please show her where the OTC version is   No facility-administered encounter medications on file as of 11/06/2019.     BP Readings from Last 3 Encounters:  07/19/19 138/75  10/14/18 (!) 148/79  07/01/18 (!) 151/81   Lab Results  Component Value Date   CHOL 175 07/19/2019   HDL 43 07/19/2019   LDLCALC 113 (H) 07/19/2019   TRIG 106 07/19/2019   CHOLHDL 4.1 07/19/2019     Goals Addressed              This Visit's Progress     Patient Stated   .  Chronic Disease Management Needs (pt-stated)        CARE PLAN ENTRY (see longtitudinal plan of care for additional care  plan information)  Current Barriers:  . Chronic Disease Management support, education, and care coordination needs related to HTN, osteoporosis, HLD, food insecurity  Clinical Goal(s) related to HTN, osteoporosis, HLD, food insecurity:  Over the next 60 days, patient will:  . Work with the care management team to address educational, disease management, and care coordination needs  . Begin or continue self health monitoring activities as directed today Measure and record blood pressure 3-4 times per week . Call provider office for new or worsened signs and symptoms Blood pressure findings outside established parameters . Call care management team with questions or concerns . Verbalize basic understanding of patient centered plan of care established today  Interventions related to HTN, osteoporosis, HLD, food insecurity:  . Evaluation of current treatment plans and patient's adherence to plan as established by provider . Chart reviewed, including recent office notes and lab results . Assessed patient understanding of disease states . Assessed patient's education and care coordination needs . Provided disease specific education to patient  . Collaborated with appropriate clinical care team members regarding patient needs . Referral to [facility] care guides for information on Food resources/pantry . Provided with RN Care Manager contact information and encouraged to reach out as needed  Patient Self Care Activities related to HTN, osteoporosis, HLD, food insecurity:  . Patient is able to manage ADLs and IADls independently . Patient would like assistance with care coordination, resources, and management of chronic conditions  Initial goal documentation  Plan:   The care management team will reach out to the patient again over the next 30 days.   Patient will talk with [facility] Care Guide regarding food resources  Demetrios Loll, BSN, RN-BC Embedded Chronic Care Manager [facility] Care Management Direct Dial: [phone]

## 2019-11-08 NOTE — Patient Instructions (Signed)
Visit Information  Goals Addressed              This Visit's Progress     Patient Stated   .  Chronic Disease Management Needs (pt-stated)        CARE PLAN ENTRY (see longtitudinal plan of care for additional care plan information)  Current Barriers:  . Chronic Disease Management support, education, and care coordination needs related to HTN, osteoporosis, HLD, food insecurity  Clinical Goal(s) related to HTN, osteoporosis, HLD, food insecurity:  Over the next 60 days, patient will:  . Work with the care management team to address educational, disease management, and care coordination needs  . Begin or continue self health monitoring activities as directed today Measure and record blood pressure 3-4 times per week . Call provider office for new or worsened signs and symptoms Blood pressure findings outside established parameters . Call care management team with questions or concerns . Verbalize basic understanding of patient centered plan of care established today  Interventions related to HTN, osteoporosis, HLD, food insecurity:  . Evaluation of current treatment plans and patient's adherence to plan as established by provider . Chart reviewed, including recent office notes and lab results . Assessed patient understanding of disease states . Assessed patient's education and care coordination needs . Provided disease specific education to patient  . Collaborated with appropriate clinical care team members regarding patient needs . Referral to [facility] care guides for information on Food resources/pantry . Provided with RN Care Manager contact information and encouraged to reach out as needed  Patient Self Care Activities related to HTN, osteoporosis, HLD, food insecurity:  . Patient is able to manage ADLs and IADls independently . Patient would like assistance with care coordination, resources, and management of chronic conditions  Initial goal documentation         [name] was given information about Chronic Care Management services today including:  1. CCM service includes personalized support from designated clinical staff supervised by her physician, including individualized plan of care and coordination with other care providers 2. 24/7 contact phone numbers for assistance for urgent and routine care needs. 3. Service will only be billed when office clinical staff spend 20 minutes or more in a month to coordinate care. 4. Only one practitioner may furnish and bill the service in a calendar month. 5. The patient may stop CCM services at any time (effective at the end of the month) by phone call to the office staff. 6. The patient will be responsible for cost sharing (co-pay) of up to 20% of the service fee (after annual deductible is met).  Patient agreed to services and verbal consent obtained.   Patient verbalizes understanding of instructions provided today.   The care management team will reach out to the patient again over the next 30 days.   Patient will talk with [facility] Care Guide regarding food resources  Chong Sicilian, BSN, RN-BC [place] / [place] Management Direct Dial: [phone]

## 2019-11-10 ENCOUNTER — Telehealth: Payer: Self-pay | Admitting: Family Medicine

## 2019-11-10 NOTE — Telephone Encounter (Signed)
Jodi Allen 11/10/2019 Called pt regarding community resource referral received. Left message for pt to call me back, my info is [phone] please see ref notes for more details.  Jodi Allen Care Guide, Embedded Care Coordination [place], Care Management    

## 2019-11-13 ENCOUNTER — Telehealth: Payer: Self-pay | Admitting: Family Medicine

## 2019-11-13 NOTE — Telephone Encounter (Signed)
Julia Kluetz 11/13/2019 Called pt regarding community resource referral received. Left message for pt to call me back, my info is [phone] please see ref notes for more details.  Julia Kluetz Care Guide, Embedded Care Coordination [place], Care Management    

## 2019-11-14 ENCOUNTER — Encounter: Payer: Self-pay | Admitting: Family Medicine

## 2019-11-20 ENCOUNTER — Other Ambulatory Visit: Payer: Medicare HMO

## 2019-11-20 DIAGNOSIS — Z20822 Contact with and (suspected) exposure to covid-19: Secondary | ICD-10-CM | POA: Diagnosis not present

## 2019-11-21 LAB — SARS-COV-2, NAA 2 DAY TAT

## 2019-11-21 LAB — NOVEL CORONAVIRUS, NAA: SARS-CoV-2, NAA: NOT DETECTED

## 2019-12-15 ENCOUNTER — Telehealth: Payer: Medicare HMO

## 2019-12-15 ENCOUNTER — Telehealth: Payer: Medicare HMO | Admitting: *Deleted

## 2019-12-15 ENCOUNTER — Other Ambulatory Visit: Payer: Self-pay

## 2019-12-15 ENCOUNTER — Other Ambulatory Visit (INDEPENDENT_AMBULATORY_CARE_PROVIDER_SITE_OTHER): Payer: Medicare HMO

## 2019-12-15 DIAGNOSIS — Z780 Asymptomatic menopausal state: Secondary | ICD-10-CM | POA: Diagnosis not present

## 2019-12-15 DIAGNOSIS — M8589 Other specified disorders of bone density and structure, multiple sites: Secondary | ICD-10-CM

## 2019-12-15 DIAGNOSIS — E559 Vitamin D deficiency, unspecified: Secondary | ICD-10-CM

## 2019-12-20 NOTE — Progress Notes (Signed)
Called patient - no answer no vm  ?

## 2020-01-31 ENCOUNTER — Ambulatory Visit (INDEPENDENT_AMBULATORY_CARE_PROVIDER_SITE_OTHER): Payer: Medicare HMO | Admitting: Family Medicine

## 2020-01-31 ENCOUNTER — Other Ambulatory Visit: Payer: Self-pay

## 2020-01-31 VITALS — BP 139/50 | HR 63 | Temp 97.2°F | Ht 64.0 in | Wt 217.0 lb

## 2020-01-31 DIAGNOSIS — G8929 Other chronic pain: Secondary | ICD-10-CM | POA: Insufficient documentation

## 2020-01-31 DIAGNOSIS — M25562 Pain in left knee: Secondary | ICD-10-CM | POA: Diagnosis not present

## 2020-01-31 DIAGNOSIS — E782 Mixed hyperlipidemia: Secondary | ICD-10-CM

## 2020-01-31 DIAGNOSIS — I10 Essential (primary) hypertension: Secondary | ICD-10-CM

## 2020-01-31 DIAGNOSIS — E559 Vitamin D deficiency, unspecified: Secondary | ICD-10-CM

## 2020-01-31 NOTE — Progress Notes (Signed)
Subjective: CC: HTN, HLD, vit d deficiency PCP: Janora Norlander, DO [id number] Jodi Allen is a 79 y.o. female presenting to clinic today for:  1. HTN, HLD Blood pressure has been diet controlled. She discontinued the statin because she was having increased myalgia with the Lipitor. She is fasting this morning would like to get her cholesterol checked. No chest pain, shortness of breath, edema or dizziness  2. Vit D Deficiency Not currently taking regular scheduled vitamin D over-the-counter but does take it at least once a week. She has known osteopenia.  3. Knee pain Patient reports left-sided knee pain is much better with the topical Voltaren gel. She uses as needed. She had no problems getting this filled with the pharmacy. Denies any falls or sensory changes  ROS: Per HPI  No Known Allergies Past Medical History:  Diagnosis Date  . GERD (gastroesophageal reflux disease)   . Osteopenia   . Osteoporosis     Current Outpatient Medications:  .  atorvastatin (LIPITOR) 20 MG tablet, Take 1 tablet (20 mg total) by mouth daily. (Patient not taking: Reported on 07/19/2019), Disp: 90 tablet, Rfl: 3 .  diclofenac Sodium (VOLTAREN) 1 % GEL, Apply 4 g topically 4 (four) times daily. (knee pain).  If not covered please show her where the OTC version is, Disp: 400 g, Rfl: 2 Social History   Socioeconomic History  . Marital status: Married    Spouse name: Nadara Mustard  . Number of children: 6  . Years of education: 45  . Highest education level: 11th grade  Occupational History  . Occupation: retired    Fish farm manager: [employer]  Tobacco Use  . Smoking status: Never Smoker  . Smokeless tobacco: Never Used  Vaping Use  . Vaping Use: Never used  Substance and Sexual Activity  . Alcohol use: No  . Drug use: No  . Sexual activity: Yes  Other Topics Concern  . Not on file  Social History Narrative  . Not on file   Social Determinants of Health   Financial Resource Strain: Medium Risk  .  Difficulty of Paying Living Expenses: Somewhat hard  Food Insecurity: Food Insecurity Present  . Worried About Charity fundraiser in the Last Year: Sometimes true  . Ran Out of Food in the Last Year: Never true  Transportation Needs: No Transportation Needs  . Lack of Transportation (Medical): No  . Lack of Transportation (Non-Medical): No  Physical Activity: Not on file  Stress: Not on file  Social Connections: Socially Integrated  . Frequency of Communication with Friends and Family: More than three times a week  . Frequency of Social Gatherings with Friends and Family: More than three times a week  . Attends Religious Services: More than 4 times per year  . Active Member of Clubs or Organizations: Yes  . Attends Archivist Meetings: More than 4 times per year  . Marital Status: Married  Human resources officer Violence: Not At Risk  . Fear of Current or Ex-Partner: No  . Emotionally Abused: No  . Physically Abused: No  . Sexually Abused: No   Family History  Problem Relation Age of Onset  . Cancer Mother        BREAST REMOVED  . Leukemia Sister   . Bladder Cancer Brother   . Arthritis Brother     Objective: Office vital signs reviewed. BP (!) 139/50   Pulse 63   Temp (!) 97.2 F (36.2 C) (Temporal)   Ht 5' 4"  (1.626  Jodi)   Wt 217 lb (98.4 kg)   SpO2 96%   BMI 37.25 kg/Jodi   Physical Examination:  General: Awake, alert, well nourished, No acute distress HEENT: Normal, sclera white, MMM Cardio: regular rate and rhythm, S1S2 heard, no murmurs appreciated Pulm: clear to auscultation bilaterally, no wheezes, rhonchi or rales; normal work of breathing on room air Extremities: warm, well perfused, No edema, cyanosis or clubbing; +2 pulses bilaterally MSK: wide based gait and normal station  Assessment/ Plan: 79 y.o. female   Essential hypertension - Plan: CMP14+EGFR  Mixed hyperlipidemia - Plan: CMP14+EGFR, Lipid Panel  Vitamin D deficiency - Plan: VITAMIN D 25  Hydroxy (Vit-D Deficiency, Fractures)  Chronic pain of left knee  Blood pressure is diet controlled. Check CMP  Recheck fasting lipid panel. We discussed consideration for Crestor 5 mg daily. She seemed amenable to this.  With regards to her vitamin D deficiency. If she still has substantial deficiency, will plan for once weekly high-dose vitamin D to simplify her regimen.  Her knee pain is doing much better with topical Voltaren gel. She can follow-up as needed on that issue  No orders of the defined types were placed in this encounter.  No orders of the defined types were placed in this encounter.    Janora Norlander, DO [place] [phone]

## 2020-01-31 NOTE — Patient Instructions (Signed)

## 2020-02-01 LAB — CMP14+EGFR
ALT: 13 IU/L (ref 0–32)
AST: 20 IU/L (ref 0–40)
Albumin/Globulin Ratio: 1.8 (ref 1.2–2.2)
Albumin: 4.4 g/dL (ref 3.7–4.7)
Alkaline Phosphatase: 89 IU/L (ref 44–121)
BUN/Creatinine Ratio: 17 (ref 12–28)
BUN: 16 mg/dL (ref 8–27)
Bilirubin Total: 0.5 mg/dL (ref 0.0–1.2)
CO2: 26 mmol/L (ref 20–29)
Calcium: 9.2 mg/dL (ref 8.7–10.3)
Chloride: 103 mmol/L (ref 96–106)
Creatinine, Ser: 0.95 mg/dL (ref 0.57–1.00)
GFR calc Af Amer: 67 mL/min/{1.73_m2} (ref >59)
GFR calc non Af Amer: 58 mL/min/{1.73_m2} — ABNORMAL LOW (ref >59)
Globulin, Total: 2.5 g/dL (ref 1.5–4.5)
Glucose: 99 mg/dL (ref 65–99)
Potassium: 4.2 mmol/L (ref 3.5–5.2)
Sodium: 140 mmol/L (ref 134–144)
Total Protein: 6.9 g/dL (ref 6.0–8.5)

## 2020-02-01 LAB — LIPID PANEL
Chol/HDL Ratio: 4.3 ratio (ref 0.0–4.4)
Cholesterol, Total: 193 mg/dL (ref 100–199)
HDL: 45 mg/dL (ref >39)
LDL Chol Calc (NIH): 131 mg/dL — ABNORMAL HIGH (ref 0–99)
Triglycerides: 92 mg/dL (ref 0–149)
VLDL Cholesterol Cal: 17 mg/dL (ref 5–40)

## 2020-02-01 LAB — VITAMIN D 25 HYDROXY (VIT D DEFICIENCY, FRACTURES): Vit D, 25-Hydroxy: 24.4 ng/mL — ABNORMAL LOW (ref 30.0–100.0)

## 2020-02-05 ENCOUNTER — Other Ambulatory Visit: Payer: Self-pay | Admitting: Family Medicine

## 2020-02-05 MED ORDER — CHOLECALCIFEROL 1.25 MG (50000 UT) PO CAPS
50000.0000 [IU] | ORAL_CAPSULE | ORAL | 3 refills | Status: DC
Start: 2020-02-05 — End: 2020-12-24

## 2020-02-05 MED ORDER — ROSUVASTATIN CALCIUM 5 MG PO TABS
5.0000 mg | ORAL_TABLET | Freq: Every day | ORAL | 3 refills | Status: DC
Start: 2020-02-05 — End: 2020-07-31

## 2020-02-16 ENCOUNTER — Ambulatory Visit (INDEPENDENT_AMBULATORY_CARE_PROVIDER_SITE_OTHER): Payer: Medicare HMO | Admitting: *Deleted

## 2020-02-16 DIAGNOSIS — Z0000 Encounter for general adult medical examination without abnormal findings: Secondary | ICD-10-CM | POA: Diagnosis not present

## 2020-02-16 NOTE — Patient Instructions (Signed)
  MEDICARE ANNUAL WELLNESS VISIT Health Maintenance Summary and Written Plan of Care  Ms. Guevara ,  Thank you for allowing me to perform your Medicare Annual Wellness Visit and for your ongoing commitment to your health.   Health Maintenance & Immunization History Health Maintenance  Topic Date Due  . COVID-19 Vaccine (3 - Booster for Moderna series) 01/16/2020  . INFLUENZA VACCINE  05/02/2020 (Originally 09/03/2019)  . PNA vac Low Risk Adult (2 of 2 - PPSV23) 01/30/2021 (Originally 04/15/2018)  . TETANUS/TDAP  06/26/2025  . DEXA SCAN  Completed  . Hepatitis C Screening  Completed   Immunization History  Administered Date(s) Administered  . Influenza, High Dose Seasonal PF 10/25/2018  . Influenza, Seasonal, Injecte, Preservative Fre 12/03/2012  . Influenza,inj,Quad PF,6+ Mos 03/10/2016  . Influenza-Unspecified 12/03/2012  . Moderna Sars-Covid-2 Vaccination 06/19/2019, 07/17/2019  . Pneumococcal Conjugate-13 04/14/2017  . Tdap 06/27/2015    These are the patient goals that we discussed: Goals Addressed            This Visit's Progress   . AWV       02/16/2020 AWV Goal: Fall Prevention  . Over the next year, patient will decrease their risk for falls by: o Using assistive devices, such as a cane or walker, as needed o Identifying fall risks within their home and correcting them by: - Removing throw rugs - Adding handrails to stairs or ramps - Removing clutter and keeping a clear pathway throughout the home - Increasing light, especially at night - Adding shower handles/bars - Raising toilet seat o Identifying potential personal risk factors for falls: - Medication side effects - Incontinence/urgency - Vestibular dysfunction - Hearing loss - Musculoskeletal disorders - Neurological disorders - Orthostatic hypotension          This is a list of Health Maintenance Items that are overdue or due now: Health Maintenance Due  Topic Date Due  . COVID-19 Vaccine (3 -  Booster for Moderna series) 01/16/2020     Orders/Referrals Placed Today: No orders of the defined types were placed in this encounter.  (Contact our referral department at [phone] if you have not spoken with someone about your referral appointment within the next 5 days)    Follow-up Plan . Follow-up with Raliegh Ip, DO as planned . Keep COVID booster appointment

## 2020-02-16 NOTE — Progress Notes (Signed)
MEDICARE ANNUAL WELLNESS VISIT  02/16/2020  Telephone Visit Disclaimer This Medicare AWV was conducted by telephone due to national recommendations for restrictions regarding the COVID-19 Pandemic (e.g. social distancing).  I verified, using two identifiers, that I am speaking with Jodi Allen or their authorized healthcare agent. I discussed the limitations, risks, security, and privacy concerns of performing an evaluation and management service by telephone and the potential availability of an in-person appointment in the future. The patient expressed understanding and agreed to proceed.  Location of Patient: Home  Location of Provider (nurse):  [facility]  Subjective:    Jodi Allen is a 80 y.o. female patient of Jodi Ip, DO who had a Medicare Annual Wellness Visit today via telephone. Jodi Allen is Retired and lives with their spouse. she has 6 children. she reports that she is socially active and does interact with friends/family regularly. she is not physically active and enjoys spending time with family and friends, cross word puzzles, and watching tv.  Patient Care Team: Jodi Ip, DO as PCP - General (Family Medicine) Gwenith Daily, RN as Case Manager  Advanced Directives 02/16/2020 09/23/2018  Does Patient Have a Medical Advance Directive? No No  Would patient like information on creating a medical advance directive? No - Patient declined No - Patient declined    Hospital Utilization Over the Past 12 Months: # of hospitalizations or ER visits: 0 # of surgeries: 0  Review of Systems    Patient reports that her overall health is unchanged compared to last year.  History obtained from chart review and the patient Musculoskeletal ROS: positive for - pain in leg - right  Patient Reported Readings (BP, Pulse, CBG, Weight, etc) none  Pain Assessment Pain : 0-10 Pain Score: 3  Pain Type: Chronic pain Pain  Location: Leg Pain Orientation: Right,Left Pain Descriptors / Indicators: Aching Pain Onset: More than a month ago Pain Frequency: Intermittent     Current Medications & Allergies (verified) Allergies as of 02/16/2020   No Known Allergies     Medication List       Accurate as of February 16, 2020 11:25 AM. If you have any questions, ask your nurse or doctor.        Cholecalciferol 1.25 MG (50000 UT) capsule Take 1 capsule (50,000 Units total) by mouth every 7 (seven) days.   diclofenac Sodium 1 % Gel Commonly known as: Voltaren Apply 4 g topically 4 (four) times daily. (knee pain).  If not covered please show her where the OTC version is   rosuvastatin 5 MG tablet Commonly known as: Crestor Take 1 tablet (5 mg total) by mouth daily. What changed: how much to take       History (reviewed): Past Medical History:  Diagnosis Date  . GERD (gastroesophageal reflux disease)   . Hyperlipidemia   . Osteopenia   . Osteoporosis    Past Surgical History:  Procedure Laterality Date  . ABDOMINAL HYSTERECTOMY     Family History  Problem Relation Age of Onset  . Cancer Mother        BREAST REMOVED  . Leukemia Sister   . Bladder Cancer Brother   . Cancer Son   . Arthritis Brother    Social History   Socioeconomic History  . Marital status: Married    Spouse name: Dimas Aguas  . Number of children: 6  . Years of education: 58  . Highest education level: 11th grade  Occupational  History  . Occupation: retired    Associate Professormployer: [employer]  Tobacco Use  . Smoking status: Never Smoker  . Smokeless tobacco: Never Used  Vaping Use  . Vaping Use: Never used  Substance and Sexual Activity  . Alcohol use: No  . Drug use: No  . Sexual activity: Yes  Other Topics Concern  . Not on file  Social History Narrative  . Not on file   Social Determinants of Health   Financial Resource Strain: Medium Risk  . Difficulty of Paying Living Expenses: Somewhat hard  Food Insecurity: Food  Insecurity Present  . Worried About Programme researcher, broadcasting/film/videounning Out of Food in the Last Year: Sometimes true  . Ran Out of Food in the Last Year: Never true  Transportation Needs: No Transportation Needs  . Lack of Transportation (Medical): No  . Lack of Transportation (Non-Medical): No  Physical Activity: Not on file  Stress: Not on file  Social Connections: Socially Integrated  . Frequency of Communication with Friends and Family: More than three times a week  . Frequency of Social Gatherings with Friends and Family: More than three times a week  . Attends Religious Services: More than 4 times per year  . Active Member of Clubs or Organizations: Yes  . Attends BankerClub or Organization Meetings: More than 4 times per year  . Marital Status: Married    Activities of Daily Living In your present state of health, do you have any difficulty performing the following activities: 02/16/2020 11/08/2019  Hearing? N N  Vision? N N  Comment Wears glasses recent eye exam and new glasses  Difficulty concentrating or making decisions? N N  Walking or climbing stairs? N N  Dressing or bathing? N N  Doing errands, shopping? N N  Preparing Food and eating ? N N  Using the Toilet? N N  In the past six months, have you accidently leaked urine? N N  Do you have problems with loss of bowel control? N N  Managing your Medications? N N  Managing your Finances? N N  Housekeeping or managing your Housekeeping? N N  Some recent data might be hidden    Patient Education/ Literacy How often do you need to have someone help you when you read instructions, pamphlets, or other written materials from your doctor or pharmacy?: 1 - Never What is the last grade level you completed in school?: 11th  Exercise Current Exercise Habits: The patient does not participate in regular exercise at present  Diet Patient reports consuming 2 meals a day and 2 snack(s) a day Patient reports that her primary diet is: Regular Patient reports that  she does have regular access to food.   Depression Screen PHQ 2/9 Scores 02/16/2020 01/31/2020 07/19/2019 10/14/2018 09/23/2018 07/01/2018 04/25/2018  PHQ - 2 Score 0 0 0 0 0 0 0  PHQ- 9 Score 0 0 0 - - - 0     Fall Risk Fall Risk  02/16/2020 01/31/2020 07/19/2019 10/14/2018 09/23/2018  Falls in the past year? 0 0 0 0 0     Objective:  Jodi Allen seemed alert and oriented and she participated appropriately during our telephone visit.  Blood Pressure Weight BMI  BP Readings from Last 3 Encounters:  01/31/20 (!) 139/50  07/19/19 138/75  10/14/18 (!) 148/79   Wt Readings from Last 3 Encounters:  01/31/20 217 lb (98.4 kg)  07/19/19 216 lb (98 kg)  10/14/18 224 lb (101.6 kg)   BMI Readings from Last 1 Encounters:  01/31/20 37.25 kg/m    *Unable to obtain current vital signs, weight, and BMI due to telephone visit type  Hearing/Vision  . Konnor did not seem to have difficulty with hearing/understanding during the telephone conversation . Reports that she has had a formal eye exam by an eye care professional within the past year . Reports that she has not had a formal hearing evaluation within the past year *Unable to fully assess hearing and vision during telephone visit type  Cognitive Function: 6CIT Screen 02/16/2020 09/23/2018  What Year? 0 points 0 points  What month? 0 points 0 points  What time? 0 points 0 points  Count back from 20 0 points 0 points  Months in reverse 0 points 0 points  Repeat phrase 0 points 0 points  Total Score 0 0   (Normal:0-7, Significant for Dysfunction: >8)  Normal Cognitive Function Screening: Yes   Immunization & Health Maintenance Record Immunization History  Administered Date(s) Administered  . Influenza, High Dose Seasonal PF 10/25/2018  . Influenza, Seasonal, Injecte, Preservative Fre 12/03/2012  . Influenza,inj,Quad PF,6+ Mos 03/10/2016  . Influenza-Unspecified 12/03/2012  . Moderna Sars-Covid-2 Vaccination 06/19/2019,  07/17/2019  . Pneumococcal Conjugate-13 04/14/2017  . Tdap 06/27/2015    Health Maintenance  Topic Date Due  . COVID-19 Vaccine (3 - Booster for Moderna series) 01/16/2020  . INFLUENZA VACCINE  05/02/2020 (Originally 09/03/2019)  . PNA vac Low Risk Adult (2 of 2 - PPSV23) 01/30/2021 (Originally 04/15/2018)  . TETANUS/TDAP  06/26/2025  . DEXA SCAN  Completed  . Hepatitis C Screening  Completed       Assessment  This is a routine wellness examination for Jodi Allen.  Health Maintenance: Due or Overdue Health Maintenance Due  Topic Date Due  . COVID-19 Vaccine (3 - Booster for Moderna series) 01/16/2020    Jodi Allen does not need a referral for Community Assistance: Care Management:   no Social Work:    no Prescription Assistance:  no Nutrition/Diabetes Education:  no   Plan:  Personalized Goals Goals Addressed            This Visit's Progress   . AWV       02/16/2020 AWV Goal: Fall Prevention  . Over the next year, patient will decrease their risk for falls by: o Using assistive devices, such as a cane or walker, as needed o Identifying fall risks within their home and correcting them by: - Removing throw rugs - Adding handrails to stairs or ramps - Removing clutter and keeping a clear pathway throughout the home - Increasing light, especially at night - Adding shower handles/bars - Raising toilet seat o Identifying potential personal risk factors for falls: - Medication side effects - Incontinence/urgency - Vestibular dysfunction - Hearing loss - Musculoskeletal disorders - Neurological disorders - Orthostatic hypotension        Personalized Health Maintenance & Screening Recommendations  COVID Booster   Lung Cancer Screening Recommended: no (Low Dose CT Chest recommended if Age 49-80 years, 30 pack-year currently smoking OR have quit w/in past 15 years) Hepatitis C Screening recommended: no HIV Screening recommended: no  Advanced  Directives: Written information was not prepared per patient's request.  Referrals & Orders No orders of the defined types were placed in this encounter.   Follow-up Plan . Follow-up with Jodi Ip, DO as planned . Keep COVID booster appointment  . AVS printed and mailed to patient    I have personally reviewed and noted the following in  the patient's chart:   . Medical and social history . Use of alcohol, tobacco or illicit drugs  . Current medications and supplements . Functional ability and status . Nutritional status . Physical activity . Advanced directives . List of other physicians . Hospitalizations, surgeries, and ER visits in previous 12 months . Vitals . Screenings to include cognitive, depression, and falls . Referrals and appointments  In addition, I have reviewed and discussed with Jodi Allen certain preventive protocols, quality metrics, and best practice recommendations. A written personalized care plan for preventive services as well as general preventive health recommendations is available and can be mailed to the patient at her request.      Billee Cashing, LPN  [id number]

## 2020-02-21 ENCOUNTER — Other Ambulatory Visit: Payer: Self-pay | Admitting: Family Medicine

## 2020-02-21 DIAGNOSIS — Z1231 Encounter for screening mammogram for malignant neoplasm of breast: Secondary | ICD-10-CM

## 2020-02-28 ENCOUNTER — Ambulatory Visit (INDEPENDENT_AMBULATORY_CARE_PROVIDER_SITE_OTHER): Payer: Medicare HMO | Admitting: Nurse Practitioner

## 2020-02-28 ENCOUNTER — Other Ambulatory Visit: Payer: Self-pay

## 2020-02-28 ENCOUNTER — Encounter: Payer: Self-pay | Admitting: Nurse Practitioner

## 2020-02-28 VITALS — BP 144/74 | HR 77 | Temp 96.7°F | Ht 64.0 in | Wt 216.6 lb

## 2020-02-28 DIAGNOSIS — R300 Dysuria: Secondary | ICD-10-CM | POA: Insufficient documentation

## 2020-02-28 LAB — URINALYSIS, COMPLETE
Bilirubin, UA: NEGATIVE
Glucose, UA: NEGATIVE
Ketones, UA: NEGATIVE
Nitrite, UA: NEGATIVE
Protein,UA: NEGATIVE
RBC, UA: NEGATIVE
Specific Gravity, UA: 1.015 (ref 1.005–1.030)
Urobilinogen, Ur: 0.2 mg/dL (ref 0.2–1.0)
pH, UA: 7.5 (ref 5.0–7.5)

## 2020-02-28 LAB — MICROSCOPIC EXAMINATION: RBC, Urine: NONE SEEN /hpf (ref 0–2)

## 2020-02-28 MED ORDER — CEFTRIAXONE SODIUM 1 G IJ SOLR
1.0000 g | Freq: Once | INTRAMUSCULAR | Status: AC
  Administered 2020-02-28: 1 g via INTRAMUSCULAR

## 2020-02-28 MED ORDER — SULFAMETHOXAZOLE-TRIMETHOPRIM 800-160 MG PO TABS: 1.0000 | ORAL_TABLET | Freq: Two times a day (BID) | ORAL | 0 refills | Status: DC

## 2020-02-28 NOTE — Assessment & Plan Note (Signed)
New UTI symptoms in the last 3 days.  Patient rates pain 6 out of 10 from a pain scale of 0-10.  Patient is reporting symptoms of burning, frequency, fever/chills less than 2 days and pelvic pain.  On assessment patient has bilateral CVA tenderness. Advised patient to increase hydration, reduce caffeine, started patient on Bactrim DS, Rocephin shot given in office, urinalysis/cultures completed results pending.   Will treat appropriately pending lab results. Rx sent to pharmacy Education provided with printed handouts given, patient verbalized understanding. Follow-up with worsening unresolved symptoms.

## 2020-02-28 NOTE — Progress Notes (Signed)
Acute Office Visit  Subjective:    Patient ID: Jodi Allen, female    DOB: 09-08-1940, 80 y.o.   MRN: [id number]  No chief complaint on file.   Dysuria  This is a new problem. Episode onset: In the past 3 days. The problem occurs every urination. The problem has been gradually worsening. The pain is at a severity of 6/10. The pain is moderate. Maximum temperature: Chills. The fever has been present for 1 - 2 days. There is no history of pyelonephritis. Associated symptoms include chills, flank pain, sweats and urgency. Pertinent negatives include no nausea or vomiting. She has tried nothing for the symptoms.     Past Medical History:  Diagnosis Date  . GERD (gastroesophageal reflux disease)   . Hyperlipidemia   . Osteopenia   . Osteoporosis     Past Surgical History:  Procedure Laterality Date  . ABDOMINAL HYSTERECTOMY      Family History  Problem Relation Age of Onset  . Cancer Mother        BREAST REMOVED  . Leukemia Sister   . Bladder Cancer Brother   . Cancer Son   . Arthritis Brother     Social History   Socioeconomic History  . Marital status: Married    Spouse name: Dimas Aguas  . Number of children: 6  . Years of education: 48  . Highest education level: 11th grade  Occupational History  . Occupation: retired    Associate Professor: [employer]  Tobacco Use  . Smoking status: Never Smoker  . Smokeless tobacco: Never Used  Vaping Use  . Vaping Use: Never used  Substance and Sexual Activity  . Alcohol use: No  . Drug use: No  . Sexual activity: Yes  Other Topics Concern  . Not on file  Social History Narrative  . Not on file   Social Determinants of Health   Financial Resource Strain: Medium Risk  . Difficulty of Paying Living Expenses: Somewhat hard  Food Insecurity: Food Insecurity Present  . Worried About Programme researcher, broadcasting/film/video in the Last Year: Sometimes true  . Ran Out of Food in the Last Year: Never true  Transportation Needs: No Transportation Needs   . Lack of Transportation (Medical): No  . Lack of Transportation (Non-Medical): No  Physical Activity: Not on file  Stress: Not on file  Social Connections: Socially Integrated  . Frequency of Communication with Friends and Family: More than three times a week  . Frequency of Social Gatherings with Friends and Family: More than three times a week  . Attends Religious Services: More than 4 times per year  . Active Member of Clubs or Organizations: Yes  . Attends Banker Meetings: More than 4 times per year  . Marital Status: Married  Catering manager Violence: Not At Risk  . Fear of Current or Ex-Partner: No  . Emotionally Abused: No  . Physically Abused: No  . Sexually Abused: No    Outpatient Medications Prior to Visit  Medication Sig Dispense Refill  . Cholecalciferol 1.25 MG (50000 UT) capsule Take 1 capsule (50,000 Units total) by mouth every 7 (seven) days. 12 capsule 3  . diclofenac Sodium (VOLTAREN) 1 % GEL Apply 4 g topically 4 (four) times daily. (knee pain).  If not covered please show her where the OTC version is 400 g 2  . rosuvastatin (CRESTOR) 5 MG tablet Take 1 tablet (5 mg total) by mouth daily. (Patient taking differently: Take 2.5 mg by mouth daily.)  90 tablet 3   No facility-administered medications prior to visit.    No Known Allergies  Review of Systems  Constitutional: Positive for chills.  HENT: Negative.   Respiratory: Negative.   Gastrointestinal: Negative for nausea and vomiting.  Genitourinary: Positive for dysuria, flank pain and urgency.  Musculoskeletal:       Pelvic pain  Skin: Negative.   All other systems reviewed and are negative.      Objective:    Physical Exam Vitals reviewed.  Constitutional:      Appearance: Normal appearance.  HENT:     Head: Normocephalic.     Nose: Nose normal.  Eyes:     Conjunctiva/sclera: Conjunctivae normal.  Cardiovascular:     Rate and Rhythm: Normal rate and regular rhythm.      Pulses: Normal pulses.     Heart sounds: Normal heart sounds.  Pulmonary:     Effort: Pulmonary effort is normal.     Breath sounds: Normal breath sounds.  Abdominal:     General: Bowel sounds are normal.     Tenderness: There is right CVA tenderness and left CVA tenderness.  Musculoskeletal:        General: Tenderness present.     Comments: Pelvic bone discomfort  Skin:    General: Skin is warm.  Neurological:     Mental Status: She is alert and oriented to person, place, and time.     BP (!) 144/74   Pulse 77   Temp (!) 96.7 F (35.9 C)   Ht 5\' 4"  (1.626 m)   Wt 216 lb 9.6 oz (98.2 kg)   SpO2 99%   BMI 37.18 kg/m  Wt Readings from Last 3 Encounters:  02/28/20 216 lb 9.6 oz (98.2 kg)  01/31/20 217 lb (98.4 kg)  07/19/19 216 lb (98 kg)    Health Maintenance Due  Topic Date Due  . COVID-19 Vaccine (3 - Booster for Moderna series) 01/16/2020    There are no preventive care reminders to display for this patient.   Lab Results  Component Value Date   TSH 2.920 03/01/2019   Lab Results  Component Value Date   WBC 4.1 07/01/2018   HGB 13.5 07/01/2018   HCT 41.0 07/01/2018   MCV 85 07/01/2018   PLT 194 07/01/2018   Lab Results  Component Value Date   NA 140 01/31/2020   K 4.2 01/31/2020   CO2 26 01/31/2020   GLUCOSE 99 01/31/2020   BUN 16 01/31/2020   CREATININE 0.95 01/31/2020   BILITOT 0.5 01/31/2020   ALKPHOS 89 01/31/2020   AST 20 01/31/2020   ALT 13 01/31/2020   PROT 6.9 01/31/2020   ALBUMIN 4.4 01/31/2020   CALCIUM 9.2 01/31/2020   Lab Results  Component Value Date   CHOL 193 01/31/2020   Lab Results  Component Value Date   HDL 45 01/31/2020   Lab Results  Component Value Date   LDLCALC 131 (H) 01/31/2020   Lab Results  Component Value Date   TRIG 92 01/31/2020   Lab Results  Component Value Date   CHOLHDL 4.3 01/31/2020   Lab Results  Component Value Date   HGBA1C 5.4 03/01/2019       Assessment & Plan:   Problem List  Items Addressed This Visit      Other   Dysuria - Primary    New UTI symptoms in the last 3 days.  Patient rates pain 6 out of 10 from a pain scale of 0-10.  Patient is reporting symptoms of burning, frequency, fever/chills less than 2 days and pelvic pain.  On assessment patient has bilateral CVA tenderness. Advised patient to increase hydration, reduce caffeine, started patient on Bactrim DS, Rocephin shot given in office, urinalysis/cultures completed results pending.   Will treat appropriately pending lab results. Rx sent to pharmacy Education provided with printed handouts given, patient verbalized understanding. Follow-up with worsening unresolved symptoms.      Relevant Medications   sulfamethoxazole-trimethoprim (BACTRIM DS) 800-160 MG tablet   Other Relevant Orders   Urinalysis, Complete   Urine Culture       Meds ordered this encounter  Medications  . cefTRIAXone (ROCEPHIN) injection 1 g  . sulfamethoxazole-trimethoprim (BACTRIM DS) 800-160 MG tablet    Sig: Take 1 tablet by mouth 2 (two) times daily.    Dispense:  20 tablet    Refill:  0    Order Specific Question:   Supervising Provider    Answer:   Raliegh Ip [[id number]]     Daryll Drown, NP

## 2020-02-28 NOTE — Patient Instructions (Signed)
Urinary Tract Infection, Adult  A urinary tract infection (UTI) is an infection of any part of the urinary tract. The urinary tract includes the kidneys, ureters, bladder, and urethra. These organs make, store, and get rid of urine in the body. An upper UTI affects the ureters and kidneys. A lower UTI affects the bladder and urethra. What are the causes? Most urinary tract infections are caused by bacteria in your genital area around your urethra, where urine leaves your body. These bacteria grow and cause inflammation of your urinary tract. What increases the risk? You are more likely to develop this condition if:  You have a urinary catheter that stays in place.  You are not able to control when you urinate or have a bowel movement (incontinence).  You are female and you: ? Use a spermicide or diaphragm for birth control. ? Have low estrogen levels. ? Are pregnant.  You have certain genes that increase your risk.  You are sexually active.  You take antibiotic medicines.  You have a condition that causes your flow of urine to slow down, such as: ? An enlarged prostate, if you are female. ? Blockage in your urethra. ? A kidney stone. ? A nerve condition that affects your bladder control (neurogenic bladder). ? Not getting enough to drink, or not urinating often.  You have certain medical conditions, such as: ? Diabetes. ? A weak disease-fighting system (immunesystem). ? Sickle cell disease. ? Gout. ? Spinal cord injury. What are the signs or symptoms? Symptoms of this condition include:  Needing to urinate right away (urgency).  Frequent urination. This may include small amounts of urine each time you urinate.  Pain or burning with urination.  Blood in the urine.  Urine that smells bad or unusual.  Trouble urinating.  Cloudy urine.  Vaginal discharge, if you are female.  Pain in the abdomen or the lower back. You may also have:  Vomiting or a decreased  appetite.  Confusion.  Irritability or tiredness.  A fever or chills.  Diarrhea. The first symptom in older adults may be confusion. In some cases, they may not have any symptoms until the infection has worsened. How is this diagnosed? This condition is diagnosed based on your medical history and a physical exam. You may also have other tests, including:  Urine tests.  Blood tests.  Tests for STIs (sexually transmitted infections). If you have had more than one UTI, a cystoscopy or imaging studies may be done to determine the cause of the infections. How is this treated? Treatment for this condition includes:  Antibiotic medicine.  Over-the-counter medicines to treat discomfort.  Drinking enough water to stay hydrated. If you have frequent infections or have other conditions such as a kidney stone, you may need to see a health care provider who specializes in the urinary tract (urologist). In rare cases, urinary tract infections can cause sepsis. Sepsis is a life-threatening condition that occurs when the body responds to an infection. Sepsis is treated in the hospital with IV antibiotics, fluids, and other medicines. Follow these instructions at home: Medicines  Take over-the-counter and prescription medicines only as told by your health care provider.  If you were prescribed an antibiotic medicine, take it as told by your health care provider. Do not stop using the antibiotic even if you start to feel better. General instructions  Make sure you: ? Empty your bladder often and completely. Do not hold urine for long periods of time. ? Empty your bladder after   sex. ? Wipe from front to back after urinating or having a bowel movement if you are female. Use each tissue only one time when you wipe.  Drink enough fluid to keep your urine pale yellow.  Keep all follow-up visits. This is important.   Contact a health care provider if:  Your symptoms do not get better after 1-2  days.  Your symptoms go away and then return. Get help right away if:  You have severe pain in your back or your lower abdomen.  You have a fever or chills.  You have nausea or vomiting. Summary  A urinary tract infection (UTI) is an infection of any part of the urinary tract, which includes the kidneys, ureters, bladder, and urethra.  Most urinary tract infections are caused by bacteria in your genital area.  Treatment for this condition often includes antibiotic medicines.  If you were prescribed an antibiotic medicine, take it as told by your health care provider. Do not stop using the antibiotic even if you start to feel better.  Keep all follow-up visits. This is important. This information is not intended to replace advice given to you by your health care provider. Make sure you discuss any questions you have with your health care provider. Document Revised: 09/01/2019 Document Reviewed: 09/01/2019 Elsevier Patient Education  2021 Elsevier Inc.  

## 2020-03-01 LAB — URINE CULTURE

## 2020-03-06 ENCOUNTER — Ambulatory Visit (INDEPENDENT_AMBULATORY_CARE_PROVIDER_SITE_OTHER): Payer: Medicare HMO

## 2020-03-06 ENCOUNTER — Other Ambulatory Visit: Payer: Self-pay

## 2020-03-06 DIAGNOSIS — Z23 Encounter for immunization: Secondary | ICD-10-CM | POA: Diagnosis not present

## 2020-03-06 NOTE — Progress Notes (Signed)
   Covid-19 Vaccination Clinic  Name:  Jodi Allen    MRN: [id number] DOB: [birth year]  03/06/2020  Jodi Allen was observed post Covid-19 immunization for 15 minutes without incident. She was provided with Vaccine Information Sheet and instruction to access the V-Safe system.   Jodi Allen was instructed to call 911 with any severe reactions post vaccine: Marland Kitchen Difficulty breathing  . Swelling of face and throat  . A fast heartbeat  . A bad rash all over body  . Dizziness and weakness   Immunizations Administered    Name Date Dose VIS Date Route   Moderna Covid-19 Booster Vaccine 03/06/2020  9:35 AM 0.25 mL 11/22/2019 Intramuscular   Manufacturer: Moderna   Lot: [id number]   NDC: 33383-291-91

## 2020-05-01 ENCOUNTER — Other Ambulatory Visit: Payer: Self-pay

## 2020-05-01 ENCOUNTER — Ambulatory Visit
Admission: RE | Admit: 2020-05-01 | Discharge: 2020-05-01 | Disposition: A | Discharge status: Home / Self Care | Payer: Medicare HMO | Source: Ambulatory Visit | Attending: Family Medicine | Admitting: Family Medicine

## 2020-05-01 DIAGNOSIS — Z1231 Encounter for screening mammogram for malignant neoplasm of breast: Secondary | ICD-10-CM

## 2020-05-10 ENCOUNTER — Encounter: Payer: Self-pay | Admitting: Family Medicine

## 2020-05-10 ENCOUNTER — Other Ambulatory Visit: Payer: Self-pay

## 2020-05-10 ENCOUNTER — Ambulatory Visit (INDEPENDENT_AMBULATORY_CARE_PROVIDER_SITE_OTHER): Payer: Medicare HMO | Admitting: Family Medicine

## 2020-05-10 VITALS — BP 147/74 | HR 72 | Temp 97.1°F | Ht 64.0 in | Wt 218.1 lb

## 2020-05-10 DIAGNOSIS — R109 Unspecified abdominal pain: Secondary | ICD-10-CM | POA: Diagnosis not present

## 2020-05-10 DIAGNOSIS — M545 Low back pain, unspecified: Secondary | ICD-10-CM

## 2020-05-10 LAB — MICROSCOPIC EXAMINATION
Bacteria, UA: NONE SEEN
RBC, Urine: NONE SEEN /hpf (ref 0–2)

## 2020-05-10 LAB — URINALYSIS, COMPLETE
Bilirubin, UA: NEGATIVE
Glucose, UA: NEGATIVE
Ketones, UA: NEGATIVE
Leukocytes,UA: NEGATIVE
Nitrite, UA: NEGATIVE
Protein,UA: NEGATIVE
RBC, UA: NEGATIVE
Specific Gravity, UA: 1.015 (ref 1.005–1.030)
Urobilinogen, Ur: 0.2 mg/dL (ref 0.2–1.0)
pH, UA: 8.5 — ABNORMAL HIGH (ref 5.0–7.5)

## 2020-05-10 MED ORDER — TIZANIDINE HCL 4 MG PO TABS: 4.0000 mg | ORAL_TABLET | Freq: Three times a day (TID) | ORAL | 0 refills | Status: DC | PRN

## 2020-05-10 NOTE — Progress Notes (Signed)
Acute Office Visit  Subjective:    Patient ID: Jodi Allen, female    DOB: 05-20-1940, 80 y.o.   MRN: [id number]  Chief Complaint  Patient presents with  . Right Side Pain    HPI Patient is in today for pain on her her right side that has been intermittent for the last week. The pain is sometimes achy and is sometimes sharp. Sometimes it radiates to her back. Sometimes it feels like a spasm. She has difficulty rating the pain. Activity makes the pain worse. The pain is better with tylenol or advil. Denies fever, nausea, vomiting, diarrhea, dysuria, or urgency. Denies injury or fall. She admits that she does not drink much water. She has not taken anything for the pain today.   Past Medical History:  Diagnosis Date  . GERD (gastroesophageal reflux disease)   . Hyperlipidemia   . Osteopenia   . Osteoporosis     Past Surgical History:  Procedure Laterality Date  . ABDOMINAL HYSTERECTOMY      Family History  Problem Relation Age of Onset  . Cancer Mother        BREAST REMOVED  . Breast cancer Mother   . Leukemia Sister   . Bladder Cancer Brother   . Cancer Son   . Arthritis Brother     Social History   Socioeconomic History  . Marital status: Married    Spouse name: Dimas Aguas  . Number of children: 6  . Years of education: 23  . Highest education level: 11th grade  Occupational History  . Occupation: retired    Associate Professor: [employer]  Tobacco Use  . Smoking status: Never Smoker  . Smokeless tobacco: Never Used  Vaping Use  . Vaping Use: Never used  Substance and Sexual Activity  . Alcohol use: No  . Drug use: No  . Sexual activity: Yes  Other Topics Concern  . Not on file  Social History Narrative  . Not on file   Social Determinants of Health   Financial Resource Strain: Medium Risk  . Difficulty of Paying Living Expenses: Somewhat hard  Food Insecurity: Food Insecurity Present  . Worried About Programme researcher, broadcasting/film/video in the Last Year: Sometimes true  .  Ran Out of Food in the Last Year: Never true  Transportation Needs: No Transportation Needs  . Lack of Transportation (Medical): No  . Lack of Transportation (Non-Medical): No  Physical Activity: Not on file  Stress: Not on file  Social Connections: Socially Integrated  . Frequency of Communication with Friends and Family: More than three times a week  . Frequency of Social Gatherings with Friends and Family: More than three times a week  . Attends Religious Services: More than 4 times per year  . Active Member of Clubs or Organizations: Yes  . Attends Banker Meetings: More than 4 times per year  . Marital Status: Married  Catering manager Violence: Not At Risk  . Fear of Current or Ex-Partner: No  . Emotionally Abused: No  . Physically Abused: No  . Sexually Abused: No    Outpatient Medications Prior to Visit  Medication Sig Dispense Refill  . Cholecalciferol 1.25 MG (50000 UT) capsule Take 1 capsule (50,000 Units total) by mouth every 7 (seven) days. 12 capsule 3  . diclofenac Sodium (VOLTAREN) 1 % GEL Apply 4 g topically 4 (four) times daily. (knee pain).  If not covered please show her where the OTC version is 400 g 2  . rosuvastatin (  CRESTOR) 5 MG tablet Take 1 tablet (5 mg total) by mouth daily. (Patient taking differently: Take 2.5 mg by mouth daily.) 90 tablet 3  . sulfamethoxazole-trimethoprim (BACTRIM DS) 800-160 MG tablet Take 1 tablet by mouth 2 (two) times daily. 20 tablet 0   No facility-administered medications prior to visit.    No Known Allergies  Review of Systems As per HPI.     Objective:    Physical Exam Vitals and nursing note reviewed.  Constitutional:      Appearance: Normal appearance.  HENT:     Head: Normocephalic and atraumatic.  Cardiovascular:     Rate and Rhythm: Normal rate.     Heart sounds: Normal heart sounds. No murmur heard.   Pulmonary:     Effort: Pulmonary effort is normal. No respiratory distress.     Breath  sounds: Normal breath sounds.  Abdominal:     General: Bowel sounds are normal. There is no distension.     Palpations: Abdomen is soft.     Tenderness: There is no abdominal tenderness. There is no right CVA tenderness, left CVA tenderness, guarding or rebound.  Musculoskeletal:     Cervical back: Neck supple.     Thoracic back: Normal.     Lumbar back: Normal.  Skin:    General: Skin is warm and dry.  Neurological:     General: No focal deficit present.     Mental Status: She is alert and oriented to person, place, and time.  Psychiatric:        Mood and Affect: Mood normal.        Behavior: Behavior normal.     BP (!) 147/74   Pulse 72   Temp (!) 97.1 F (36.2 C) (Temporal)   Ht 5\' 4"  (1.626 m)   Wt 218 lb 2 oz (98.9 kg)   BMI 37.44 kg/m  Wt Readings from Last 3 Encounters:  05/10/20 218 lb 2 oz (98.9 kg)  02/28/20 216 lb 9.6 oz (98.2 kg)  01/31/20 217 lb (98.4 kg)    There are no preventive care reminders to display for this patient.  There are no preventive care reminders to display for this patient.   Lab Results  Component Value Date   TSH 2.920 03/01/2019   Lab Results  Component Value Date   WBC 4.1 07/01/2018   HGB 13.5 07/01/2018   HCT 41.0 07/01/2018   MCV 85 07/01/2018   PLT 194 07/01/2018   Lab Results  Component Value Date   NA 140 01/31/2020   K 4.2 01/31/2020   CO2 26 01/31/2020   GLUCOSE 99 01/31/2020   BUN 16 01/31/2020   CREATININE 0.95 01/31/2020   BILITOT 0.5 01/31/2020   ALKPHOS 89 01/31/2020   AST 20 01/31/2020   ALT 13 01/31/2020   PROT 6.9 01/31/2020   ALBUMIN 4.4 01/31/2020   CALCIUM 9.2 01/31/2020   Lab Results  Component Value Date   CHOL 193 01/31/2020   Lab Results  Component Value Date   HDL 45 01/31/2020   Lab Results  Component Value Date   LDLCALC 131 (H) 01/31/2020   Lab Results  Component Value Date   TRIG 92 01/31/2020   Lab Results  Component Value Date   CHOLHDL 4.3 01/31/2020   Lab Results   Component Value Date   HGBA1C 5.4 03/01/2019       Assessment & Plan:   Jodi Allen was seen today for right side pain.  Diagnoses and all orders for  this visit:  Acute right-sided thoracic back pain UA negative. Exam unremarkable. Zanaflex as needed for spasms. Tylenol, heat for pain. Return to office for new or worsening symptoms, or if symptoms persist.  -     Urinalysis, Complete -     tiZANidine (ZANAFLEX) 4 MG tablet; Take 1 tablet (4 mg total) by mouth every 8 (eight) hours as needed for muscle spasms.  The patient indicates understanding of these issues and agrees with the plan.  Gabriel Earing, FNP

## 2020-05-10 NOTE — Patient Instructions (Signed)
Muscle Pain, Adult Muscle pain, also called myalgia, is a condition in which a person has pain in one or more muscles in the body. Muscle pain may be mild, moderate, or severe. It may feel sharp, achy, or burning. In most cases, the pain lasts only a short time and goes away without treatment. Muscle pain can result from using muscles in a new or different way or after a period of inactivity. It is normal to feel some muscle pain after starting an exercise program. Muscles that have not been used often will be sore at first. What are the causes? This condition is caused by using muscles in a new or different way after a period of inactivity. Other causes may include:  Overuse or muscle strain, especially if you are not in shape. This is the most common cause of muscle pain.  Injury or bruising.  Infectious diseases, including diseases caused by viruses, such as the flu (influenza).  Fibromyalgia.This is a long-term, or chronic, condition that causes muscle tenderness, tiredness (fatigue), and headache.  Autoimmune or rheumatologic diseases. These are conditions, such as lupus, in which the body's defense system (immunesystem) attacks areas in the body.  Certain medicines, including ACE inhibitors and statins. What are the signs or symptoms? The main symptom of this condition is sore or painful muscles, including during activity and when stretching. You may also have slight swelling. How is this diagnosed? This condition is diagnosed with a physical exam. Your health care provider will ask questions about your pain and when it began. If you have not had muscle pain for very long, your health care provider may want to wait before doing much testing. If your muscle pain has lasted a long time, tests may be done right away. In some cases, this may include tests to rule out certain conditions or illnesses. How is this treated? Treatment for this condition depends on the cause. Home care is often  enough to relieve muscle pain. Your health care provider may also prescribe NSAIDs, such as ibuprofen. Follow these instructions at home: Medicines  Take over-the-counter and prescription medicines only as told by your health care provider.  Ask your health care provider if the medicine prescribed to you requires you to avoid driving or using machinery. Managing pain, swelling, and discomfort  If directed, put ice on the painful area. To do this: ? Put ice in a plastic bag. ? Place a towel between your skin and the bag. ? Leave the ice on for 20 minutes, 2-3 times a day.  For the first 2 days of muscle soreness, or if there is swelling: ? Do not soak in hot baths. ? Do not use a hot tub, steam room, sauna, heating pad, or other heat source.  After 48-72 hours, you may alternate between applying ice and applying heat as told by your health care provider. If directed, apply heat to the affected area as often as told by your health care provider. Use the heat source that your health care provider recommends, such as a moist heat pack or a heating pad. ? Place a towel between your skin and the heat source. ? Leave the heat on for 20-30 minutes. ? Remove the heat if your skin turns bright red. This is especially important if you are unable to feel pain, heat, or cold. You may have a greater risk of getting burned.  If you have an injury, raise (elevate) the injured area above the level of your heart while you   are sitting or lying down.      Activity  If overuse is causing your muscle pain: ? Slow down your activities until the pain goes away. ? Do regular, gentle exercises if you are not usually active. ? Warm up before exercising. Stretch before and after exercising. This can help lower the risk of muscle pain.  Do not continue working out if the pain is severe. Severe pain could mean that you have injured a muscle.  Do not lift anything that is heavier than 5-10 lb (2.3-4.5 kg), or  the limit that you are told, until your health care provider says that it is safe.  Return to your normal activities as told by your health care provider. Ask your health care provider what activities are safe for you.   General instructions  Do not use any products that contain nicotine or tobacco, such as cigarettes, e-cigarettes, and chewing tobacco. These can delay healing. If you need help quitting, ask your health care provider.  Keep all follow-up visits as told by your health care provider. This is important. Contact a health care provider if you have:  Muscle pain that gets worse and medicines do not help.  Muscle pain that lasts longer than 3 days.  A rash or fever along with muscle pain.  Muscle pain after a tick bite.  Muscle pain while working out, even though you are in good physical condition.  Redness, soreness, or swelling along with muscle pain.  Muscle pain after starting a new medicine or changing the dose of a medicine. Get help right away if you have:  Trouble breathing.  Trouble swallowing.  Muscle pain along with a stiff neck, fever, and vomiting.  Severe muscle weakness or you cannot move part of your body. These symptoms may represent a serious problem that is an emergency. Do not wait to see if the symptoms will go away. Get medical help right away. Call your local emergency services (911 in the [place] Do not drive yourself to the hospital. Summary  Muscle pain usually lasts only a short time and goes away without treatment.  This condition is caused by using muscles in a new or different way after a period of inactivity.  If your muscle pain lasts longer than 3 days, tell your health care provider. This information is not intended to replace advice given to you by your health care provider. Make sure you discuss any questions you have with your health care provider. Document Revised: 10/28/2018 Document Reviewed: 10/28/2018 Elsevier Patient  Education  2021 Elsevier Inc.  

## 2020-07-30 ENCOUNTER — Encounter: Payer: Self-pay | Admitting: Family Medicine

## 2020-07-30 ENCOUNTER — Other Ambulatory Visit: Payer: Self-pay

## 2020-07-30 ENCOUNTER — Ambulatory Visit (INDEPENDENT_AMBULATORY_CARE_PROVIDER_SITE_OTHER): Payer: Medicare Other | Admitting: Family Medicine

## 2020-07-30 VITALS — BP 139/74 | HR 69 | Temp 97.6°F | Ht 64.0 in | Wt 217.4 lb

## 2020-07-30 DIAGNOSIS — G8929 Other chronic pain: Secondary | ICD-10-CM | POA: Diagnosis not present

## 2020-07-30 DIAGNOSIS — M545 Low back pain, unspecified: Secondary | ICD-10-CM | POA: Diagnosis not present

## 2020-07-30 DIAGNOSIS — M8589 Other specified disorders of bone density and structure, multiple sites: Secondary | ICD-10-CM

## 2020-07-30 DIAGNOSIS — Z0000 Encounter for general adult medical examination without abnormal findings: Secondary | ICD-10-CM

## 2020-07-30 DIAGNOSIS — M1712 Unilateral primary osteoarthritis, left knee: Secondary | ICD-10-CM | POA: Diagnosis not present

## 2020-07-30 DIAGNOSIS — Z0001 Encounter for general adult medical examination with abnormal findings: Secondary | ICD-10-CM | POA: Diagnosis not present

## 2020-07-30 DIAGNOSIS — E559 Vitamin D deficiency, unspecified: Secondary | ICD-10-CM

## 2020-07-30 DIAGNOSIS — E782 Mixed hyperlipidemia: Secondary | ICD-10-CM | POA: Diagnosis not present

## 2020-07-30 DIAGNOSIS — Z6837 Body mass index (BMI) 37.0-37.9, adult: Secondary | ICD-10-CM

## 2020-07-30 MED ORDER — DICLOFENAC SODIUM 1 % EX GEL: 4.0000 g | Freq: Four times a day (QID) | CUTANEOUS | 2 refills | Status: DC

## 2020-07-30 MED ORDER — TIZANIDINE HCL 4 MG PO TABS: 4.0000 mg | ORAL_TABLET | Freq: Three times a day (TID) | ORAL | 0 refills | Status: DC | PRN

## 2020-07-30 NOTE — Patient Instructions (Signed)
You had labs performed today.  You will be contacted with the results of the labs once they are available, usually in the next 3 business days for routine lab work.  If you have an active my chart account, they will be released to your MyChart.  If you prefer to have these labs released to you via telephone, please let us know.   Preventive Care 78 Years and Older, Female Preventive care refers to lifestyle choices and visits with your health care provider that can promote health and wellness. This includes: A yearly physical exam. This is also called an annual wellness visit. Regular dental and eye exams. Immunizations. Screening for certain conditions. Healthy lifestyle choices, such as: Eating a healthy diet. Getting regular exercise. Not using drugs or products that contain nicotine and tobacco. Limiting alcohol use. What can I expect for my preventive care visit? Physical exam Your health care provider will check your: Height and weight. These may be used to calculate your BMI (body mass index). BMI is a measurement that tells if you are at a healthy weight. Heart rate and blood pressure. Body temperature. Skin for abnormal spots. Counseling Your health care provider may ask you questions about your: Past medical problems. Family's medical history. Alcohol, tobacco, and drug use. Emotional well-being. Home life and relationship well-being. Sexual activity. Diet, exercise, and sleep habits. History of falls. Memory and ability to understand (cognition). Work and work Statistician. Pregnancy and menstrual history. Access to firearms. What immunizations do I need?  Vaccines are usually given at various ages, according to a schedule. Your health care provider will recommend vaccines for you based on your age, medicalhistory, and lifestyle or other factors, such as travel or where you work. What tests do I need? Blood tests Lipid and cholesterol levels. These may be checked  every 5 years, or more often depending on your overall health. Hepatitis C test. Hepatitis B test. Screening Lung cancer screening. You may have this screening every year starting at age 58 if you have a 30-pack-year history of smoking and currently smoke or have quit within the past 15 years. Colorectal cancer screening. All adults should have this screening starting at age 53 and continuing until age 56. Your health care provider may recommend screening at age 28 if you are at increased risk. You will have tests every 1-10 years, depending on your results and the type of screening test. Diabetes screening. This is done by checking your blood sugar (glucose) after you have not eaten for a while (fasting). You may have this done every 1-3 years. Mammogram. This may be done every 1-2 years. Talk with your health care provider about how often you should have regular mammograms. Abdominal aortic aneurysm (AAA) screening. You may need this if you are a current or former smoker. BRCA-related cancer screening. This may be done if you have a family history of breast, ovarian, tubal, or peritoneal cancers. Other tests STD (sexually transmitted disease) testing, if you are at risk. Bone density scan. This is done to screen for osteoporosis. You may have this done starting at age 46. Talk with your health care provider about your test results, treatment options,and if necessary, the need for more tests. Follow these instructions at home: Eating and drinking  Eat a diet that includes fresh fruits and vegetables, whole grains, lean protein, and low-fat dairy products. Limit your intake of foods with high amounts of sugar, saturated fats, and salt. Take vitamin and mineral supplements as recommended by your  health care provider. Do not drink alcohol if your health care provider tells you not to drink. If you drink alcohol: Limit how much you have to 0-1 drink a day. Be aware of how much alcohol is in  your drink. In the [place] one drink equals one 12 oz bottle of beer (355 mL), one 5 oz glass of wine (148 mL), or one 1 oz glass of hard liquor (44 mL).  Lifestyle Take daily care of your teeth and gums. Brush your teeth every morning and night with fluoride toothpaste. Floss one time each day. Stay active. Exercise for at least 30 minutes 5 or more days each week. Do not use any products that contain nicotine or tobacco, such as cigarettes, e-cigarettes, and chewing tobacco. If you need help quitting, ask your health care provider. Do not use drugs. If you are sexually active, practice safe sex. Use a condom or other form of protection in order to prevent STIs (sexually transmitted infections). Talk with your health care provider about taking a low-dose aspirin or statin. Find healthy ways to cope with stress, such as: Meditation, yoga, or listening to music. Journaling. Talking to a trusted person. Spending time with friends and family. Safety Always wear your seat belt while driving or riding in a vehicle. Do not drive: If you have been drinking alcohol. Do not ride with someone who has been drinking. When you are tired or distracted. While texting. Wear a helmet and other protective equipment during sports activities. If you have firearms in your house, make sure you follow all gun safety procedures. What's next? Visit your health care provider once a year for an annual wellness visit. Ask your health care provider how often you should have your eyes and teeth checked. Stay up to date on all vaccines. This information is not intended to replace advice given to you by your health care provider. Make sure you discuss any questions you have with your healthcare provider. Document Revised: 01/10/2020 Document Reviewed: 01/13/2018 Elsevier Patient Education  2022 [employer].

## 2020-07-30 NOTE — Progress Notes (Signed)
Jodi Allen is a 80 y.o. female presents to office today for annual physical exam examination.    Concerns today include: 1. none  Occupation: retired  Diet: fair, not rich in fruits/ veggies, Exercise: no structured Last eye exam: due soon Last dental exam: UTD Last colonoscopy: UTD Last mammogram: UTD Last pap smear: n/a Refills needed today: all Immunizations needed: Needs shingles vaccination but over $150 today so she will hold off on this till next visit Immunization History  Administered Date(s) Administered   Influenza, High Dose Seasonal PF 10/25/2018   Influenza, Seasonal, Injecte, Preservative Fre 12/03/2012   Influenza,inj,Quad PF,6+ Mos 03/10/2016   Influenza-Unspecified 12/03/2012   Moderna SARS-COV2 Booster Vaccination 03/06/2020   Moderna Sars-Covid-2 Vaccination 06/19/2019, 07/17/2019   Pneumococcal Conjugate-13 04/14/2017   Tdap 06/27/2015     Past Medical History:  Diagnosis Date   GERD (gastroesophageal reflux disease)    Hyperlipidemia    Osteopenia    Osteoporosis    Social History   Socioeconomic History   Marital status: Married    Spouse name: Nadara Mustard   Number of children: 6   Years of education: 11   Highest education level: 11th grade  Occupational History   Occupation: retired    Fish farm manager: [employer]  Tobacco Use   Smoking status: Never   Smokeless tobacco: Never  Vaping Use   Vaping Use: Never used  Substance and Sexual Activity   Alcohol use: No   Drug use: No   Sexual activity: Yes  Other Topics Concern   Not on file  Social History Narrative   Not on file   Social Determinants of Health   Financial Resource Strain: Medium Risk   Difficulty of Paying Living Expenses: Somewhat hard  Food Insecurity: Food Insecurity Present   Worried About Charity fundraiser in the Last Year: Sometimes true   Arboriculturist in the Last Year: Never true  Transportation Needs: No Transportation Needs   Lack of Transportation  (Medical): No   Lack of Transportation (Non-Medical): No  Physical Activity: Not on file  Stress: Not on file  Social Connections: Socially Integrated   Frequency of Communication with Friends and Family: More than three times a week   Frequency of Social Gatherings with Friends and Family: More than three times a week   Attends Religious Services: More than 4 times per year   Active Member of [employer] or Organizations: Yes   Attends Music therapist: More than 4 times per year   Marital Status: Married  Human resources officer Violence: Not At Risk   Fear of Current or Ex-Partner: No   Emotionally Abused: No   Physically Abused: No   Sexually Abused: No   Past Surgical History:  Procedure Laterality Date   ABDOMINAL HYSTERECTOMY     Family History  Problem Relation Age of Onset   Cancer Mother        BREAST REMOVED   Breast cancer Mother    Leukemia Sister    Bladder Cancer Brother    Cancer Son    Arthritis Brother     Current Outpatient Medications:    diclofenac Sodium (VOLTAREN) 1 % GEL, Apply 4 g topically 4 (four) times daily. (knee pain).  If not covered please show her where the OTC version is, Disp: 400 g, Rfl: 2   Cholecalciferol 1.25 MG (50000 UT) capsule, Take 1 capsule (50,000 Units total) by mouth every 7 (seven) days. (Patient not taking: Reported on 07/30/2020), Disp:  12 capsule, Rfl: 3   rosuvastatin (CRESTOR) 5 MG tablet, Take 1 tablet (5 mg total) by mouth daily. (Patient not taking: Reported on 07/30/2020), Disp: 90 tablet, Rfl: 3   tiZANidine (ZANAFLEX) 4 MG tablet, Take 1 tablet (4 mg total) by mouth every 8 (eight) hours as needed for muscle spasms. (Patient not taking: Reported on 07/30/2020), Disp: 30 tablet, Rfl: 0  No Known Allergies   ROS: Review of Systems Pertinent items noted in HPI and remainder of comprehensive ROS otherwise negative.    Physical exam BP 139/74   Pulse 69   Temp 97.6 F (36.4 C)   Ht 5' 4" (1.626 m)   Wt 217 lb 6.4  oz (98.6 kg)   SpO2 99%   BMI 37.32 kg/m  General appearance: alert, cooperative, appears stated age, no distress, and moderately obese Head: Normocephalic, without obvious abnormality, atraumatic Eyes: negative findings: lids and lashes normal, conjunctivae and sclerae normal, corneas clear, and pupils equal, round, reactive to light and accomodation Ears: normal TM's and external ear canals both ears Nose: Nares normal. Septum midline. Mucosa normal. No drainage or sinus tenderness. Throat: lips, mucosa, and tongue normal; teeth and gums normal Neck: no adenopathy, supple, symmetrical, trachea midline, and thyroid not enlarged, symmetric, no tenderness/mass/nodules Back: symmetric, no curvature. ROM normal. No CVA tenderness. Lungs: clear to auscultation bilaterally Heart: regular rate and rhythm, S1, S2 normal, no murmur, click, rub or gallop Abdomen: soft, non-tender; bowel sounds normal; no masses,  no organomegaly Extremities: extremities normal, atraumatic, no cyanosis or edema Pulses: 2+ and symmetric Skin: Skin color, texture, turgor normal. No rashes or lesions Lymph nodes: Cervical, supraclavicular, and axillary nodes normal. Neurologic: Alert and oriented X 3, normal strength and tone. Normal symmetric reflexes. Normal coordination and gait Psych: Mood stable, speech normal   Assessment/ Plan: Jodi Allen here for annual physical exam.   Annual physical exam  Mixed hyperlipidemia - Plan: Lipid Panel, CMP14+EGFR  Vitamin D deficiency - Plan: VITAMIN D 25 Hydroxy (Vit-D Deficiency, Fractures)  Primary osteoarthritis of left knee - Plan: diclofenac Sodium (VOLTAREN) 1 % GEL  Osteopenia of multiple sites  Chronic right-sided low back pain without sciatica - Plan: tiZANidine (ZANAFLEX) 4 MG tablet  Morbid obesity (HCC)  BMI 37.0-37.9, adult  Check fasting lipid panel, CMP  Recheck vitamin D.  Currently taking over-the-counter vitamin D  intermittently  Continue Voltaren gel for knee pain as needed  Reinforced balanced diet with adequate water and fiber intake  Patient to follow up in 1 year for annual exam or sooner if needed.  Jodi Prout M. Lajuana Ripple, DO

## 2020-07-31 ENCOUNTER — Other Ambulatory Visit: Payer: Self-pay | Admitting: Family Medicine

## 2020-07-31 DIAGNOSIS — E782 Mixed hyperlipidemia: Secondary | ICD-10-CM

## 2020-07-31 LAB — CMP14+EGFR
ALT: 12 IU/L (ref 0–32)
AST: 21 IU/L (ref 0–40)
Albumin/Globulin Ratio: 1.6 (ref 1.2–2.2)
Albumin: 4.4 g/dL (ref 3.7–4.7)
Alkaline Phosphatase: 88 IU/L (ref 44–121)
BUN/Creatinine Ratio: 15 (ref 12–28)
BUN: 15 mg/dL (ref 8–27)
Bilirubin Total: 0.5 mg/dL (ref 0.0–1.2)
CO2: 25 mmol/L (ref 20–29)
Calcium: 9.6 mg/dL (ref 8.7–10.3)
Chloride: 100 mmol/L (ref 96–106)
Creatinine, Ser: 1.02 mg/dL — ABNORMAL HIGH (ref 0.57–1.00)
Globulin, Total: 2.8 g/dL (ref 1.5–4.5)
Glucose: 92 mg/dL (ref 65–99)
Potassium: 4.9 mmol/L (ref 3.5–5.2)
Sodium: 138 mmol/L (ref 134–144)
Total Protein: 7.2 g/dL (ref 6.0–8.5)
eGFR: 57 mL/min/{1.73_m2} — ABNORMAL LOW (ref >59)

## 2020-07-31 LAB — LIPID PANEL
Chol/HDL Ratio: 4.1 ratio (ref 0.0–4.4)
Cholesterol, Total: 199 mg/dL (ref 100–199)
HDL: 48 mg/dL (ref >39)
LDL Chol Calc (NIH): 127 mg/dL — ABNORMAL HIGH (ref 0–99)
Triglycerides: 137 mg/dL (ref 0–149)
VLDL Cholesterol Cal: 24 mg/dL (ref 5–40)

## 2020-07-31 LAB — VITAMIN D 25 HYDROXY (VIT D DEFICIENCY, FRACTURES): Vit D, 25-Hydroxy: 28.5 ng/mL — ABNORMAL LOW (ref 30.0–100.0)

## 2020-07-31 MED ORDER — ROSUVASTATIN CALCIUM 10 MG PO TABS: 10.0000 mg | ORAL_TABLET | Freq: Every day | ORAL | 3 refills | Status: DC

## 2020-12-21 ENCOUNTER — Other Ambulatory Visit: Payer: Self-pay | Admitting: Family Medicine

## 2020-12-21 DIAGNOSIS — Z1231 Encounter for screening mammogram for malignant neoplasm of breast: Secondary | ICD-10-CM

## 2020-12-24 ENCOUNTER — Encounter: Payer: Self-pay | Admitting: Family Medicine

## 2020-12-24 ENCOUNTER — Other Ambulatory Visit: Payer: Self-pay

## 2020-12-24 ENCOUNTER — Ambulatory Visit (INDEPENDENT_AMBULATORY_CARE_PROVIDER_SITE_OTHER): Payer: Medicare Other | Admitting: Family Medicine

## 2020-12-24 VITALS — BP 137/78 | HR 81 | Temp 98.2°F | Ht 64.0 in | Wt 218.0 lb

## 2020-12-24 DIAGNOSIS — G8929 Other chronic pain: Secondary | ICD-10-CM

## 2020-12-24 DIAGNOSIS — M545 Low back pain, unspecified: Secondary | ICD-10-CM | POA: Diagnosis not present

## 2020-12-24 DIAGNOSIS — M25512 Pain in left shoulder: Secondary | ICD-10-CM

## 2020-12-24 MED ORDER — CHOLECALCIFEROL 1.25 MG (50000 UT) PO CAPS: 50000.0000 [IU] | ORAL_CAPSULE | ORAL | 3 refills | Status: DC

## 2020-12-24 MED ORDER — TIZANIDINE HCL 4 MG PO TABS: 4.0000 mg | ORAL_TABLET | Freq: Three times a day (TID) | ORAL | 0 refills | Status: DC | PRN

## 2020-12-24 NOTE — Progress Notes (Signed)
Subjective: PP:IRJJOACZ pain PCP: Raliegh Ip, DO Jodi Allen is a 80 y.o. female presenting to clinic today for:  1. Shoulder pain She reports abrupt onset of severe left shoulder pain.  She points to the outside at the left upper extremity.  Denies any swelling, erythema or heat.  She just got back into the gym and admits that she has been doing more exercises compared to previous.  Symptoms have really gotten better on their own.  She still feels a little pulling but really the pain is not present anymore.  She has plenty of Voltaren gel if needed but has not been utilizing as she did not realize that she could use that on her shoulder.  Denies any sensory changes or weakness.  No preceding injury.  Needs refills on her muscle relaxer and vitamin D   ROS: Per HPI  No Known Allergies Past Medical History:  Diagnosis Date   GERD (gastroesophageal reflux disease)    Hyperlipidemia    Osteopenia    Osteoporosis     Current Outpatient Medications:    rosuvastatin (CRESTOR) 10 MG tablet, Take 1 tablet (10 mg total) by mouth daily., Disp: 90 tablet, Rfl: 3   Cholecalciferol 1.25 MG (50000 UT) capsule, Take 1 capsule (50,000 Units total) by mouth every 7 (seven) days. (Patient not taking: Reported on 07/30/2020), Disp: 12 capsule, Rfl: 3   diclofenac Sodium (VOLTAREN) 1 % GEL, Apply 4 g topically 4 (four) times daily. (knee pain).  If not covered please show her where the OTC version is (Patient not taking: Reported on 12/24/2020), Disp: 400 g, Rfl: 2   tiZANidine (ZANAFLEX) 4 MG tablet, Take 1 tablet (4 mg total) by mouth every 8 (eight) hours as needed for muscle spasms., Disp: 30 tablet, Rfl: 0 Social History   Socioeconomic History   Marital status: Married    Spouse name: Dimas Aguas   Number of children: 6   Years of education: 11   Highest education level: 11th grade  Occupational History   Occupation: retired    Associate Professor: [employer]  Tobacco Use   Smoking status:  Never   Smokeless tobacco: Never  Vaping Use   Vaping Use: Never used  Substance and Sexual Activity   Alcohol use: No   Drug use: No   Sexual activity: Yes  Other Topics Concern   Not on file  Social History Narrative   Not on file   Social Determinants of Health   Financial Resource Strain: Not on file  Food Insecurity: Not on file  Transportation Needs: Not on file  Physical Activity: Not on file  Stress: Not on file  Social Connections: Not on file  Intimate Partner Violence: Not on file   Family History  Problem Relation Age of Onset   Cancer Mother        BREAST REMOVED   Breast cancer Mother    Leukemia Sister    Bladder Cancer Brother    Cancer Son    Arthritis Brother     Objective: Office vital signs reviewed. BP 137/78   Pulse 81   Temp 98.2 F (36.8 C) (Temporal)   Ht 5\' 4"  (1.626 m)   Wt 218 lb (98.9 kg)   SpO2 99%   BMI 37.42 kg/m   Physical Examination:  General: Awake, alert, well nourished, No acute distress MSK:   Left shoulder: She has full active range of motion in all planes.  No tenderness palpation or deformity to the left shoulder.  Negative empty can, negative Hawkins test.  Assessment/ Plan: 80 y.o. female   Acute pain of left shoulder  Chronic right-sided low back pain without sciatica - Plan: tiZANidine (ZANAFLEX) 4 MG tablet  Probably had a little tendinitis.  Her exam today was unremarkable.  Certainly no evidence of rotator cuff tear.  We discussed that she can absolutely use that Voltaren gel on her shoulder should she need it.  Encouraged her to follow-up with me if any other concerns arise  No orders of the defined types were placed in this encounter.  No orders of the defined types were placed in this encounter.    Raliegh Ip, DO [facility] [phone]

## 2021-01-22 ENCOUNTER — Telehealth: Payer: Self-pay | Admitting: Family Medicine

## 2021-01-22 ENCOUNTER — Encounter: Payer: Self-pay | Admitting: Family Medicine

## 2021-01-22 ENCOUNTER — Ambulatory Visit (INDEPENDENT_AMBULATORY_CARE_PROVIDER_SITE_OTHER): Payer: Medicare Other | Admitting: Family Medicine

## 2021-01-22 DIAGNOSIS — R0981 Nasal congestion: Secondary | ICD-10-CM | POA: Diagnosis not present

## 2021-01-22 DIAGNOSIS — M791 Myalgia, unspecified site: Secondary | ICD-10-CM

## 2021-01-22 DIAGNOSIS — R509 Fever, unspecified: Secondary | ICD-10-CM | POA: Diagnosis not present

## 2021-01-22 DIAGNOSIS — U071 COVID-19: Secondary | ICD-10-CM | POA: Diagnosis not present

## 2021-01-22 LAB — VERITOR FLU A/B WAIVED
Influenza A: NEGATIVE
Influenza B: NEGATIVE

## 2021-01-22 NOTE — Telephone Encounter (Signed)
Patient had a visit with [facility] today.  Was a medication supposed to be sent in? Please advise and send to pools

## 2021-01-22 NOTE — Telephone Encounter (Signed)
Patient aware.

## 2021-01-22 NOTE — Progress Notes (Signed)
Virtual Visit via telephone note Due to COVID-19 pandemic this visit was conducted virtually. This visit type was conducted due to national recommendations for restrictions regarding the COVID-19 Pandemic (e.g. social distancing, sheltering in place) in an effort to limit this patient's exposure and mitigate transmission in our community. All issues noted in this document were discussed and addressed.  A physical exam was not performed with this format.   I connected with Jodi Allen on 01/22/2021 at 1300 by telephone and verified that I am speaking with the correct person using two identifiers. Jodi Allen is currently located at home and patient is currently with them during visit. The provider, Kari Baars, FNP is located in their office at time of visit.  I discussed the limitations, risks, security and privacy concerns of performing an evaluation and management service by virtual visit and the availability of in person appointments. I also discussed with the patient that there may be a patient responsible charge related to this service. The patient expressed understanding and agreed to proceed.  Subjective:  Patient ID: Jodi Allen, female    DOB: [birth year], 80 y.o.   MRN: [id number]  Chief Complaint:  URI   HPI: Jodi Allen is a 80 y.o. female presenting on 01/22/2021 for URI   Patient reports chills, rhinorrhea, cough, congestion, and myalgias since yesterday.  She has been taking Tylenol with some relief of symptoms.  No known sick exposures.  URI  This is a new problem. The current episode started yesterday. The problem has been gradually worsening. Associated symptoms include congestion, coughing and rhinorrhea. Pertinent negatives include no abdominal pain, chest pain, diarrhea, dysuria, ear pain, headaches, joint pain, joint swelling, nausea, neck pain, plugged ear sensation, rash, sinus pain, sneezing, sore throat, swollen glands, vomiting or  wheezing. She has tried acetaminophen for the symptoms. The treatment provided mild relief.    Relevant past medical, surgical, family, and social history reviewed and updated as indicated.  Allergies and medications reviewed and updated.   Past Medical History:  Diagnosis Date   GERD (gastroesophageal reflux disease)    Hyperlipidemia    Osteopenia    Osteoporosis     Past Surgical History:  Procedure Laterality Date   ABDOMINAL HYSTERECTOMY      Social History   Socioeconomic History   Marital status: Married    Spouse name: Dimas Aguas   Number of children: 6   Years of education: 11   Highest education level: 11th grade  Occupational History   Occupation: retired    Associate Professor: [employer]  Tobacco Use   Smoking status: Never   Smokeless tobacco: Never  Vaping Use   Vaping Use: Never used  Substance and Sexual Activity   Alcohol use: No   Drug use: No   Sexual activity: Yes  Other Topics Concern   Not on file  Social History Narrative   Not on file   Social Determinants of Health   Financial Resource Strain: Not on file  Food Insecurity: Not on file  Transportation Needs: Not on file  Physical Activity: Not on file  Stress: Not on file  Social Connections: Not on file  Intimate Partner Violence: Not on file    Outpatient Encounter Medications as of 01/22/2021  Medication Sig   Cholecalciferol 1.25 MG (50000 UT) capsule Take 1 capsule (50,000 Units total) by mouth every 7 (seven) days.   diclofenac Sodium (VOLTAREN) 1 % GEL Apply 4 g topically 4 (four) times daily. (knee pain).  If not covered please show her where the OTC version is (Patient not taking: Reported on 12/24/2020)   rosuvastatin (CRESTOR) 10 MG tablet Take 1 tablet (10 mg total) by mouth daily.   tiZANidine (ZANAFLEX) 4 MG tablet Take 1 tablet (4 mg total) by mouth every 8 (eight) hours as needed for muscle spasms.   No facility-administered encounter medications on file as of 01/22/2021.    No  Known Allergies  Review of Systems  Constitutional:  Positive for activity change, chills and fatigue. Negative for appetite change, diaphoresis, fever and unexpected weight change.  HENT:  Positive for congestion and rhinorrhea. Negative for ear pain, sinus pressure, sinus pain, sneezing and sore throat.   Respiratory:  Positive for cough. Negative for shortness of breath and wheezing.   Cardiovascular:  Negative for chest pain.  Gastrointestinal:  Negative for abdominal pain, diarrhea, nausea and vomiting.  Genitourinary:  Negative for decreased urine volume and dysuria.  Musculoskeletal:  Positive for myalgias. Negative for joint pain and neck pain.  Skin:  Negative for rash.  Neurological:  Negative for weakness and headaches.  Psychiatric/Behavioral:  Negative for confusion.   All other systems reviewed and are negative.       Observations/Objective: No vital signs or physical exam, this was a virtual health encounter.  Pt alert and oriented, answers all questions appropriately, and able to speak in full sentences.    Assessment and Plan: Jodi Allen was seen today for uri.  Diagnoses and all orders for this visit:  Congested nose Fever and chills Myalgia URI symptoms, onset yesterday.  Will test for influenza and COVID.  Antiviral therapy will be initiated if positive.  Symptomatic care at home discussed in detail.  Patient aware to report any new, worsening, persistent symptoms.  Follow-up as needed or if not improving. -     Veritor Flu A/B Waived -     Novel Coronavirus, NAA ([facility])     Follow Up Instructions: Return if symptoms worsen or fail to improve.    I discussed the assessment and treatment plan with the patient. The patient was provided an opportunity to ask questions and all were answered. The patient agreed with the plan and demonstrated an understanding of the instructions.   The patient was advised to call back or seek an in-person evaluation if the  symptoms worsen or if the condition fails to improve as anticipated.  The above assessment and management plan was discussed with the patient. The patient verbalized understanding of and has agreed to the management plan. Patient is aware to call the clinic if they develop any new symptoms or if symptoms persist or worsen. Patient is aware when to return to the clinic for a follow-up visit. Patient educated on when it is appropriate to go to the emergency department.    I provided 15 minutes of time during this telephone encounter.   Kari Baars, FNP-C [facility] [address] [place], [place] [address] [phone] 01/22/2021

## 2021-01-23 ENCOUNTER — Telehealth: Payer: Self-pay | Admitting: Family Medicine

## 2021-01-23 DIAGNOSIS — U071 COVID-19: Secondary | ICD-10-CM

## 2021-01-23 LAB — SARS-COV-2, NAA 2 DAY TAT

## 2021-01-23 LAB — NOVEL CORONAVIRUS, NAA: SARS-CoV-2, NAA: DETECTED — AB

## 2021-01-23 MED ORDER — MOLNUPIRAVIR EUA 200MG CAPSULE: 4.0000 | ORAL_CAPSULE | Freq: Two times a day (BID) | ORAL | 0 refills | Status: AC

## 2021-01-23 MED ORDER — MOLNUPIRAVIR EUA 200MG CAPSULE: 4.0000 | ORAL_CAPSULE | Freq: Two times a day (BID) | ORAL | 0 refills | Status: DC

## 2021-01-23 NOTE — Addendum Note (Signed)
Addended by: Sonny Masters on: 01/23/2021 12:48 PM   Modules accepted: Orders

## 2021-01-23 NOTE — Telephone Encounter (Signed)
Meds resent to [facility]

## 2021-03-03 ENCOUNTER — Inpatient Hospital Stay: Admission: RE | Admit: 2021-03-03 | Payer: Medicare HMO | Source: Ambulatory Visit

## 2021-03-28 DIAGNOSIS — H40033 Anatomical narrow angle, bilateral: Secondary | ICD-10-CM | POA: Diagnosis not present

## 2021-03-28 DIAGNOSIS — H2513 Age-related nuclear cataract, bilateral: Secondary | ICD-10-CM | POA: Diagnosis not present

## 2021-04-21 ENCOUNTER — Ambulatory Visit (INDEPENDENT_AMBULATORY_CARE_PROVIDER_SITE_OTHER): Payer: Medicare Other

## 2021-04-21 ENCOUNTER — Ambulatory Visit: Payer: Medicare Other

## 2021-04-21 VITALS — Ht 64.0 in | Wt 218.0 lb

## 2021-04-21 DIAGNOSIS — Z0001 Encounter for general adult medical examination with abnormal findings: Secondary | ICD-10-CM | POA: Diagnosis not present

## 2021-04-21 DIAGNOSIS — Z0000 Encounter for general adult medical examination without abnormal findings: Secondary | ICD-10-CM

## 2021-04-21 DIAGNOSIS — M8589 Other specified disorders of bone density and structure, multiple sites: Secondary | ICD-10-CM | POA: Diagnosis not present

## 2021-04-21 NOTE — Patient Instructions (Signed)
Ms. Pinkney , ?Thank you for taking time to come for your Medicare Wellness Visit. I appreciate your ongoing commitment to your health goals. Please review the following plan we discussed and let me know if I can assist you in the future.  ? ?Screening recommendations/referrals: ?Colonoscopy: Done 03/10/2010. No longer required.  ?Mammogram: Done 05/01/2020. Repeat annually ? ?Bone Density: Order placed today.  ? ?Recommended yearly ophthalmology/optometry visit for glaucoma screening and checkup ?Recommended yearly dental visit for hygiene and checkup ? ?Vaccinations: ?Influenza vaccine: Due Repeat annually ? ?Pneumococcal vaccine: Done 04/14/2017. Second dose due at your convenience.  ?Tdap vaccine: Done 06/27/2015 Repeat in 10 years ? ?Shingles vaccine: Discussed.   ?Covid-19:Done 03/06/2020, 07/17/2019 and 06/19/2019. ? ?Advanced directives: Advance directive discussed with you today. Even though you declined this today, please call our office should you change your mind, and we can give you the proper paperwork for you to fill out. ? ? ?Conditions/risks identified: Aim for 30 minutes of exercise or brisk walking, 6-8 glasses of water, and 5 servings of fruits and vegetables each day. ?KEEP UP THE GOOD WORK!! ? ?Next appointment: Follow up in one year for your annual wellness visit 2024. ? ? ?Preventive Care 68 Years and Older, Female ?Preventive care refers to lifestyle choices and visits with your health care provider that can promote health and wellness. ?What does preventive care include? ?A yearly physical exam. This is also called an annual well check. ?Dental exams once or twice a year. ?Routine eye exams. Ask your health care provider how often you should have your eyes checked. ?Personal lifestyle choices, including: ?Daily care of your teeth and gums. ?Regular physical activity. ?Eating a healthy diet. ?Avoiding tobacco and drug use. ?Limiting alcohol use. ?Practicing safe sex. ?Taking low-dose aspirin every  day. ?Taking vitamin and mineral supplements as recommended by your health care provider. ?What happens during an annual well check? ?The services and screenings done by your health care provider during your annual well check will depend on your age, overall health, lifestyle risk factors, and family history of disease. ?Counseling  ?Your health care provider may ask you questions about your: ?Alcohol use. ?Tobacco use. ?Drug use. ?Emotional well-being. ?Home and relationship well-being. ?Sexual activity. ?Eating habits. ?History of falls. ?Memory and ability to understand (cognition). ?Work and work Statistician. ?Reproductive health. ?Screening  ?You may have the following tests or measurements: ?Height, weight, and BMI. ?Blood pressure. ?Lipid and cholesterol levels. These may be checked every 5 years, or more frequently if you are over 68 years old. ?Skin check. ?Lung cancer screening. You may have this screening every year starting at age 95 if you have a 30-pack-year history of smoking and currently smoke or have quit within the past 15 years. ?Fecal occult blood test (FOBT) of the stool. You may have this test every year starting at age 36. ?Flexible sigmoidoscopy or colonoscopy. You may have a sigmoidoscopy every 5 years or a colonoscopy every 10 years starting at age 20. ?Hepatitis C blood test. ?Hepatitis B blood test. ?Sexually transmitted disease (STD) testing. ?Diabetes screening. This is done by checking your blood sugar (glucose) after you have not eaten for a while (fasting). You may have this done every 1-3 years. ?Bone density scan. This is done to screen for osteoporosis. You may have this done starting at age 2. ?Mammogram. This may be done every 1-2 years. Talk to your health care provider about how often you should have regular mammograms. ?Talk with your health care provider  about your test results, treatment options, and if necessary, the need for more tests. ?Vaccines  ?Your health care  provider may recommend certain vaccines, such as: ?Influenza vaccine. This is recommended every year. ?Tetanus, diphtheria, and acellular pertussis (Tdap, Td) vaccine. You may need a Td booster every 10 years. ?Zoster vaccine. You may need this after age 39. ?Pneumococcal 13-valent conjugate (PCV13) vaccine. One dose is recommended after age 31. ?Pneumococcal polysaccharide (PPSV23) vaccine. One dose is recommended after age 41. ?Talk to your health care provider about which screenings and vaccines you need and how often you need them. ?This information is not intended to replace advice given to you by your health care provider. Make sure you discuss any questions you have with your health care provider. ?Document Released: 02/15/2015 Document Revised: 10/09/2015 Document Reviewed: 11/20/2014 ?Elsevier Interactive Patient Education ? 2017 [employer]. ? ?Fall Prevention in the Home ?Falls can cause injuries. They can happen to people of all ages. There are many things you can do to make your home safe and to help prevent falls. ?What can I do on the outside of my home? ?Regularly fix the edges of walkways and driveways and fix any cracks. ?Remove anything that might make you trip as you walk through a door, such as a raised step or threshold. ?Trim any bushes or trees on the path to your home. ?Use bright outdoor lighting. ?Clear any walking paths of anything that might make someone trip, such as rocks or tools. ?Regularly check to see if handrails are loose or broken. Make sure that both sides of any steps have handrails. ?Any raised decks and porches should have guardrails on the edges. ?Have any leaves, snow, or ice cleared regularly. ?Use sand or salt on walking paths during winter. ?Clean up any spills in your garage right away. This includes oil or grease spills. ?What can I do in the bathroom? ?Use night lights. ?Install grab bars by the toilet and in the tub and shower. Do not use towel bars as grab  bars. ?Use non-skid mats or decals in the tub or shower. ?If you need to sit down in the shower, use a plastic, non-slip stool. ?Keep the floor dry. Clean up any water that spills on the floor as soon as it happens. ?Remove soap buildup in the tub or shower regularly. ?Attach bath mats securely with double-sided non-slip rug tape. ?Do not have throw rugs and other things on the floor that can make you trip. ?What can I do in the bedroom? ?Use night lights. ?Make sure that you have a light by your bed that is easy to reach. ?Do not use any sheets or blankets that are too big for your bed. They should not hang down onto the floor. ?Have a firm chair that has side arms. You can use this for support while you get dressed. ?Do not have throw rugs and other things on the floor that can make you trip. ?What can I do in the kitchen? ?Clean up any spills right away. ?Avoid walking on wet floors. ?Keep items that you use a lot in easy-to-reach places. ?If you need to reach something above you, use a strong step stool that has a grab bar. ?Keep electrical cords out of the way. ?Do not use floor polish or wax that makes floors slippery. If you must use wax, use non-skid floor wax. ?Do not have throw rugs and other things on the floor that can make you trip. ?What can I do  with my stairs? ?Do not leave any items on the stairs. ?Make sure that there are handrails on both sides of the stairs and use them. Fix handrails that are broken or loose. Make sure that handrails are as long as the stairways. ?Check any carpeting to make sure that it is firmly attached to the stairs. Fix any carpet that is loose or worn. ?Avoid having throw rugs at the top or bottom of the stairs. If you do have throw rugs, attach them to the floor with carpet tape. ?Make sure that you have a light switch at the top of the stairs and the bottom of the stairs. If you do not have them, ask someone to add them for you. ?What else can I do to help prevent  falls? ?Wear shoes that: ?Do not have high heels. ?Have rubber bottoms. ?Are comfortable and fit you well. ?Are closed at the toe. Do not wear sandals. ?If you use a stepladder: ?Make sure that it is fully opened. Do not

## 2021-04-21 NOTE — Progress Notes (Signed)
? ?Subjective:  ? Jodi Allen is a 81 y.o. female who presents for Medicare Annual (Subsequent) preventive examination. ?Virtual Visit via Telephone Note ? ?I connected with  Jodi Allen on 04/21/21 at  9:45 AM EDT by telephone and verified that I am speaking with the correct person using two identifiers. ? ?Location: ?Patient: HOME ?Provider: [facility] ?Persons participating in the virtual visit: patient/Nurse Health Advisor ?  ?I discussed the limitations, risks, security and privacy concerns of performing an evaluation and management service by telephone and the availability of in person appointments. The patient expressed understanding and agreed to proceed. ? ?Interactive audio and video telecommunications were attempted between this nurse and patient, however failed, due to patient having technical difficulties OR patient did not have access to video capability.  We continued and completed visit with audio only. ? ?Some vital signs may be absent or patient reported.  ? ?Jodi Dash, Jodi Allen ? ?Review of Systems    ? ?Cardiac Risk Factors include: advanced age (>23men, >40 women);hypertension;dyslipidemia;sedentary lifestyle;obesity (BMI >30kg/m2) ? ?   ?Objective:  ?  ?Today's Vitals  ? 04/21/21 0947  ?Weight: 218 lb (98.9 kg)  ?Height: 5\' 4"  (1.626 m)  ? ?Body mass index is 37.42 kg/m?. ? ?Advanced Directives 04/21/2021 02/16/2020 09/23/2018  ?Does Patient Have a Medical Advance Directive? No No No  ?Would patient like information on creating a medical advance directive? No - Patient declined No - Patient declined No - Patient declined  ? ? ?Current Medications (verified) ?Outpatient Encounter Medications as of 04/21/2021  ?Medication Sig  ? Cholecalciferol 1.25 MG (50000 UT) capsule Take 1 capsule (50,000 Units total) by mouth every 7 (seven) days.  ? diclofenac Sodium (VOLTAREN) 1 % GEL Apply 4 g topically 4 (four) times daily. (knee pain).  If not covered please show her where the OTC version is   ? rosuvastatin (CRESTOR) 10 MG tablet Take 1 tablet (10 mg total) by mouth daily.  ? tiZANidine (ZANAFLEX) 4 MG tablet Take 1 tablet (4 mg total) by mouth every 8 (eight) hours as needed for muscle spasms.  ? ?No facility-administered encounter medications on file as of 04/21/2021.  ? ? ?Allergies (verified) ?Patient has no known allergies.  ? ?History: ?Past Medical History:  ?Diagnosis Date  ? GERD (gastroesophageal reflux disease)   ? Hyperlipidemia   ? Osteopenia   ? Osteoporosis   ? ?Past Surgical History:  ?Procedure Laterality Date  ? ABDOMINAL HYSTERECTOMY    ? ?Family History  ?Problem Relation Age of Onset  ? Cancer Mother   ?     BREAST REMOVED  ? Breast cancer Mother   ? Leukemia Sister   ? Bladder Cancer Brother   ? Cancer Son   ? Arthritis Brother   ? ?Social History  ? ?Socioeconomic History  ? Marital status: Married  ?  Spouse name: 04/23/2021  ? Number of children: 6  ? Years of education: 53  ? Highest education level: 11th grade  ?Occupational History  ? Occupation: retired  ?  Employer: [employer]  ?Tobacco Use  ? Smoking status: Never  ? Smokeless tobacco: Never  ?Vaping Use  ? Vaping Use: Never used  ?Substance and Sexual Activity  ? Alcohol use: No  ? Drug use: No  ? Sexual activity: Yes  ?Other Topics Concern  ? Not on file  ?Social History Narrative  ? Not on file  ? ?Social Determinants of Health  ? ?Financial Resource Strain: Low Risk   ?  Difficulty of Paying Living Expenses: Not hard at all  ?Food Insecurity: No Food Insecurity  ? Worried About Programme researcher, broadcasting/film/videounning Out of Food in the Last Year: Never true  ? Ran Out of Food in the Last Year: Never true  ?Transportation Needs: No Transportation Needs  ? Lack of Transportation (Medical): No  ? Lack of Transportation (Non-Medical): No  ?Physical Activity: Insufficiently Active  ? Days of Exercise per Week: 2 days  ? Minutes of Exercise per Session: 60 min  ?Stress: No Stress Concern Present  ? Feeling of Stress : Only a little  ?Social Connections: Socially  Integrated  ? Frequency of Communication with Friends and Family: More than three times a week  ? Frequency of Social Gatherings with Friends and Family: More than three times a week  ? Attends Religious Services: More than 4 times per year  ? Active Member of Clubs or Organizations: Yes  ? Attends BankerClub or Organization Meetings: More than 4 times per year  ? Marital Status: Married  ? ? ?Tobacco Counseling ?Counseling given: Not Answered ? ? ?Clinical Intake: ? ?Pre-visit preparation completed: Yes ? ?Pain : No/denies pain ? ?  ? ?BMI - recorded: 37.42 ?Nutritional Status: BMI > 30  Obese ?Nutritional Risks: None ?Diabetes: No ? ?How often do you need to have someone help you when you read instructions, pamphlets, or other written materials from your doctor or pharmacy?: 1 - Never ? ?Diabetic?NO ? ?Interpreter Needed?: No ? ?Information entered by :: Jodi Lajuane Leatham, Jodi Allen ? ? ?Activities of Daily Living ?In your present state of health, do you have any difficulty performing the following activities: 04/21/2021  ?Hearing? N  ?Vision? N  ?Difficulty concentrating or making decisions? N  ?Walking or climbing stairs? N  ?Dressing or bathing? N  ?Doing errands, shopping? N  ?Preparing Food and eating ? N  ?Using the Toilet? N  ?In the past six months, have you accidently leaked urine? N  ?Do you have problems with loss of bowel control? N  ?Managing your Medications? N  ?Managing your Finances? N  ?Housekeeping or managing your Housekeeping? N  ?Some recent data might be hidden  ? ? ?Patient Care Team: ?[name], [name], DO as PCP - General (Family Medicine) ?Gwenith DailyHudy, Kristen N, RN as Case Manager ? ?Indicate any recent Medical Services you may have received from other than [facility] providers in the past year (date may be approximate). ? ?   ?Assessment:  ? This is a routine wellness examination for Jodi PhilipsGeraldine. ? ?Hearing/Vision screen ?Hearing Screening - Comments:: No hearing issues.  ?Vision Screening - Comments:: Readers. 03/2021.  [facility] [place] ? ?Dietary issues and exercise activities discussed: ?Current Exercise Habits: Structured exercise class, Type of exercise: Other - see comments (Pt goes to local [employer] to do exercises class.), Time (Minutes): 60, Frequency (Times/Week): 2, Weekly Exercise (Minutes/Week): 120, Intensity: Mild, Exercise limited by: cardiac condition(s) ? ? Goals Addressed   ? ?  ?  ?  ?  ? This Visit's Progress  ?  AWV   On track  ?  02/16/2020 ?AWV Goal: Fall Prevention ? ?Over the next year, patient will decrease their risk for falls by: ?Using assistive devices, such as a cane or walker, as needed ?Identifying fall risks within their home and correcting them by: ?Removing throw rugs ?Adding handrails to stairs or ramps ?Removing clutter and keeping a clear pathway throughout the home ?Increasing light, especially at night ?Adding shower handles/bars ?Raising toilet seat ?Identifying potential personal risk factors  for falls: ?Medication side effects ?Incontinence/urgency ?Vestibular dysfunction ?Hearing loss ?Musculoskeletal disorders ?Neurological disorders ?Orthostatic hypotension ?  ?  ?  DIET - INCREASE WATER INTAKE   On track  ?  Try to drink 6-8 glasses of water daily. ?  ?  Exercise 150 min/wk Moderate Activity   Not on track  ? ?  ? ?Depression Screen ?PHQ 2/9 Scores 04/21/2021 12/24/2020 07/30/2020 05/10/2020 02/28/2020 02/16/2020 01/31/2020  ?PHQ - 2 Score 1 1 0 0 0 0 0  ?PHQ- 9 Score - 4 - - - 0 0  ?  ?Fall Risk ?Fall Risk  04/21/2021 12/24/2020 07/30/2020 05/10/2020 02/28/2020  ?Falls in the past year? 0 0 0 0 0  ?Number falls in past yr: 0 0 - - -  ?Injury with Fall? 0 0 - - -  ?Risk for fall due to : No Fall Risks No Fall Risks - - -  ?Follow up Education provided;Falls prevention discussed Falls evaluation completed - - -  ? ? ?FALL RISK PREVENTION PERTAINING TO THE HOME: ? ?Any stairs in or around the home? Yes  ?If so, are there any without handrails? No  ?Home free of loose throw rugs in walkways, pet beds,  electrical cords, etc? Yes  ?Adequate lighting in your home to reduce risk of falls? Yes  ? ?ASSISTIVE DEVICES UTILIZED TO PREVENT FALLS: ? ?Life alert? No  ?Use of a cane, walker or w/c? No  ?Grab bars in

## 2021-05-07 ENCOUNTER — Ambulatory Visit (INDEPENDENT_AMBULATORY_CARE_PROVIDER_SITE_OTHER): Payer: Medicare Other

## 2021-05-07 ENCOUNTER — Ambulatory Visit
Admission: RE | Admit: 2021-05-07 | Discharge: 2021-05-07 | Disposition: A | Discharge status: Home / Self Care | Payer: Medicare Other | Source: Ambulatory Visit | Attending: Family Medicine | Admitting: Family Medicine

## 2021-05-07 DIAGNOSIS — M8588 Other specified disorders of bone density and structure, other site: Secondary | ICD-10-CM

## 2021-05-07 DIAGNOSIS — Z780 Asymptomatic menopausal state: Secondary | ICD-10-CM | POA: Diagnosis not present

## 2021-05-07 DIAGNOSIS — M8589 Other specified disorders of bone density and structure, multiple sites: Secondary | ICD-10-CM | POA: Diagnosis not present

## 2021-05-07 DIAGNOSIS — Z1231 Encounter for screening mammogram for malignant neoplasm of breast: Secondary | ICD-10-CM

## 2021-06-23 NOTE — Progress Notes (Unsigned)
Jodi Allen is a 81 y.o. female presents to office today for annual physical exam examination.    Concerns today include: 1. ***  Occupation: ***, Marital status: ***, Substance use: *** Diet: ***, Exercise: *** Last eye exam: *** Last dental exam: *** Last colonoscopy: *** Last mammogram: *** Last pap smear: *** Refills needed today: *** Immunizations needed: Immunization History  Administered Date(s) Administered   Influenza, High Dose Seasonal PF 10/25/2018   Influenza, Seasonal, Injecte, Preservative Fre 12/03/2012   Influenza,inj,Quad PF,6+ Mos 03/10/2016   Influenza-Unspecified 12/03/2012   Moderna SARS-COV2 Booster Vaccination 03/06/2020   Moderna Sars-Covid-2 Vaccination 06/19/2019, 07/17/2019   Pneumococcal Conjugate-13 04/14/2017   Tdap 06/27/2015     Past Medical History:  Diagnosis Date   GERD (gastroesophageal reflux disease)    Hyperlipidemia    Osteopenia    Osteoporosis    Social History   Socioeconomic History   Marital status: Married    Spouse name: Dimas Aguas   Number of children: 6   Years of education: 11   Highest education level: 11th grade  Occupational History   Occupation: retired    Associate Professor: [employer]  Tobacco Use   Smoking status: Never   Smokeless tobacco: Never  Vaping Use   Vaping Use: Never used  Substance and Sexual Activity   Alcohol use: No   Drug use: No   Sexual activity: Yes  Other Topics Concern   Not on file  Social History Narrative   Not on file   Social Determinants of Health   Financial Resource Strain: Low Risk    Difficulty of Paying Living Expenses: Not hard at all  Food Insecurity: No Food Insecurity   Worried About Programme researcher, broadcasting/film/video in the Last Year: Never true   Barista in the Last Year: Never true  Transportation Needs: No Transportation Needs   Lack of Transportation (Medical): No   Lack of Transportation (Non-Medical): No  Physical Activity: Insufficiently Active   Days of  Exercise per Week: 2 days   Minutes of Exercise per Session: 60 min  Stress: No Stress Concern Present   Feeling of Stress : Only a little  Social Connections: Press photographer of Communication with Friends and Family: More than three times a week   Frequency of Social Gatherings with Friends and Family: More than three times a week   Attends Religious Services: More than 4 times per year   Active Member of [employer] or Organizations: Yes   Attends Engineer, structural: More than 4 times per year   Marital Status: Married  Catering manager Violence: Not At Risk   Fear of Current or Ex-Partner: No   Emotionally Abused: No   Physically Abused: No   Sexually Abused: No   Past Surgical History:  Procedure Laterality Date   ABDOMINAL HYSTERECTOMY     Family History  Problem Relation Age of Onset   Cancer Mother        BREAST REMOVED   Breast cancer Mother    Leukemia Sister    Bladder Cancer Brother    Cancer Son    Arthritis Brother     Current Outpatient Medications:    Cholecalciferol 1.25 MG (50000 UT) capsule, Take 1 capsule (50,000 Units total) by mouth every 7 (seven) days., Disp: 12 capsule, Rfl: 3   diclofenac Sodium (VOLTAREN) 1 % GEL, Apply 4 g topically 4 (four) times daily. (knee pain).  If not covered please show her where  the OTC version is, Disp: 400 g, Rfl: 2   rosuvastatin (CRESTOR) 10 MG tablet, Take 1 tablet (10 mg total) by mouth daily., Disp: 90 tablet, Rfl: 3   tiZANidine (ZANAFLEX) 4 MG tablet, Take 1 tablet (4 mg total) by mouth every 8 (eight) hours as needed for muscle spasms., Disp: 30 tablet, Rfl: 0  No Known Allergies   ROS: Review of Systems {ros; complete:30496}    Physical exam {Exam, Complete:[phone]}    Assessment/ Plan: Jodi Allen here for annual physical exam.   No problem-specific Assessment & Plan notes found for this encounter.   Counseled on healthy lifestyle choices, including diet (rich in  fruits, vegetables and lean meats and low in salt and simple carbohydrates) and exercise (at least 30 minutes of moderate physical activity daily).  Patient to follow up in 1 year for annual exam or sooner if needed.  Jodi Speth M. Nadine Counts, DO

## 2021-06-24 ENCOUNTER — Encounter: Payer: Self-pay | Admitting: Family Medicine

## 2021-06-24 ENCOUNTER — Ambulatory Visit (INDEPENDENT_AMBULATORY_CARE_PROVIDER_SITE_OTHER): Payer: Medicare Other | Admitting: Family Medicine

## 2021-06-24 VITALS — BP 139/71 | HR 70 | Temp 97.7°F | Ht 64.0 in | Wt 221.4 lb

## 2021-06-24 DIAGNOSIS — Z0001 Encounter for general adult medical examination with abnormal findings: Secondary | ICD-10-CM | POA: Diagnosis not present

## 2021-06-24 DIAGNOSIS — M545 Low back pain, unspecified: Secondary | ICD-10-CM

## 2021-06-24 DIAGNOSIS — Z0000 Encounter for general adult medical examination without abnormal findings: Secondary | ICD-10-CM

## 2021-06-24 DIAGNOSIS — E559 Vitamin D deficiency, unspecified: Secondary | ICD-10-CM | POA: Diagnosis not present

## 2021-06-24 DIAGNOSIS — E782 Mixed hyperlipidemia: Secondary | ICD-10-CM

## 2021-06-24 DIAGNOSIS — Z23 Encounter for immunization: Secondary | ICD-10-CM

## 2021-06-24 DIAGNOSIS — G8929 Other chronic pain: Secondary | ICD-10-CM

## 2021-06-24 DIAGNOSIS — I10 Essential (primary) hypertension: Secondary | ICD-10-CM | POA: Diagnosis not present

## 2021-06-24 DIAGNOSIS — R739 Hyperglycemia, unspecified: Secondary | ICD-10-CM | POA: Diagnosis not present

## 2021-06-24 DIAGNOSIS — M8589 Other specified disorders of bone density and structure, multiple sites: Secondary | ICD-10-CM | POA: Diagnosis not present

## 2021-06-24 LAB — BAYER DCA HB A1C WAIVED: HB A1C (BAYER DCA - WAIVED): 5.3 % (ref 4.8–5.6)

## 2021-06-24 MED ORDER — CHOLECALCIFEROL 1.25 MG (50000 UT) PO CAPS: 50000.0000 [IU] | ORAL_CAPSULE | ORAL | 3 refills | Status: DC

## 2021-06-24 MED ORDER — ROSUVASTATIN CALCIUM 10 MG PO TABS: 10.0000 mg | ORAL_TABLET | Freq: Every day | ORAL | 3 refills | Status: DC

## 2021-06-24 MED ORDER — TIZANIDINE HCL 4 MG PO TABS: 4.0000 mg | ORAL_TABLET | Freq: Three times a day (TID) | ORAL | 0 refills | Status: DC | PRN

## 2021-06-24 NOTE — Patient Instructions (Signed)
You had labs performed today.  You will be contacted with the results of the labs once they are available, usually in the next 3 business days for routine lab work.  If you have an active my chart account, they will be released to your MyChart.  If you prefer to have these labs released to you via telephone, please let us know.   Preventive Care 24 Years and Older, Female Preventive care refers to lifestyle choices and visits with your health care provider that can promote health and wellness. Preventive care visits are also called wellness exams. What can I expect for my preventive care visit? Counseling Your health care provider may ask you questions about your: Medical history, including: Past medical problems. Family medical history. Pregnancy and menstrual history. History of falls. Current health, including: Memory and ability to understand (cognition). Emotional well-being. Home life and relationship well-being. Sexual activity and sexual health. Lifestyle, including: Alcohol, nicotine or tobacco, and drug use. Access to firearms. Diet, exercise, and sleep habits. Work and work Statistician. Sunscreen use. Safety issues such as seatbelt and bike helmet use. Physical exam Your health care provider will check your: Height and weight. These may be used to calculate your BMI (body mass index). BMI is a measurement that tells if you are at a healthy weight. Waist circumference. This measures the distance around your waistline. This measurement also tells if you are at a healthy weight and may help predict your risk of certain diseases, such as type 2 diabetes and high blood pressure. Heart rate and blood pressure. Body temperature. Skin for abnormal spots. What immunizations do I need?  Vaccines are usually given at various ages, according to a schedule. Your health care provider will recommend vaccines for you based on your age, medical history, and lifestyle or other factors, such  as travel or where you work. What tests do I need? Screening Your health care provider may recommend screening tests for certain conditions. This may include: Lipid and cholesterol levels. Hepatitis C test. Hepatitis B test. HIV (human immunodeficiency virus) test. STI (sexually transmitted infection) testing, if you are at risk. Lung cancer screening. Colorectal cancer screening. Diabetes screening. This is done by checking your blood sugar (glucose) after you have not eaten for a while (fasting). Mammogram. Talk with your health care provider about how often you should have regular mammograms. BRCA-related cancer screening. This may be done if you have a family history of breast, ovarian, tubal, or peritoneal cancers. Bone density scan. This is done to screen for osteoporosis. Talk with your health care provider about your test results, treatment options, and if necessary, the need for more tests. Follow these instructions at home: Eating and drinking  Eat a diet that includes fresh fruits and vegetables, whole grains, lean protein, and low-fat dairy products. Limit your intake of foods with high amounts of sugar, saturated fats, and salt. Take vitamin and mineral supplements as recommended by your health care provider. Do not drink alcohol if your health care provider tells you not to drink. If you drink alcohol: Limit how much you have to 0-1 drink a day. Know how much alcohol is in your drink. In the [place] one drink equals one 12 oz bottle of beer (355 mL), one 5 oz glass of wine (148 mL), or one 1 oz glass of hard liquor (44 mL). Lifestyle Brush your teeth every morning and night with fluoride toothpaste. Floss one time each day. Exercise for at least 30 minutes 5 or more  days each week. Do not use any products that contain nicotine or tobacco. These products include cigarettes, chewing tobacco, and vaping devices, such as e-cigarettes. If you need help quitting, ask your health  care provider. Do not use drugs. If you are sexually active, practice safe sex. Use a condom or other form of protection in order to prevent STIs. Take aspirin only as told by your health care provider. Make sure that you understand how much to take and what form to take. Work with your health care provider to find out whether it is safe and beneficial for you to take aspirin daily. Ask your health care provider if you need to take a cholesterol-lowering medicine (statin). Find healthy ways to manage stress, such as: Meditation, yoga, or listening to music. Journaling. Talking to a trusted person. Spending time with friends and family. Minimize exposure to UV radiation to reduce your risk of skin cancer. Safety Always wear your seat belt while driving or riding in a vehicle. Do not drive: If you have been drinking alcohol. Do not ride with someone who has been drinking. When you are tired or distracted. While texting. If you have been using any mind-altering substances or drugs. Wear a helmet and other protective equipment during sports activities. If you have firearms in your house, make sure you follow all gun safety procedures. What's next? Visit your health care provider once a year for an annual wellness visit. Ask your health care provider how often you should have your eyes and teeth checked. Stay up to date on all vaccines. This information is not intended to replace advice given to you by your health care provider. Make sure you discuss any questions you have with your health care provider. Document Revised: 07/17/2020 Document Reviewed: 07/17/2020 Elsevier Patient Education  2023 Elsevier Inc.   

## 2021-06-25 LAB — LIPID PANEL
Chol/HDL Ratio: 4 ratio (ref 0.0–4.4)
Cholesterol, Total: 174 mg/dL (ref 100–199)
HDL: 43 mg/dL (ref >39)
LDL Chol Calc (NIH): 109 mg/dL — ABNORMAL HIGH (ref 0–99)
Triglycerides: 123 mg/dL (ref 0–149)
VLDL Cholesterol Cal: 22 mg/dL (ref 5–40)

## 2021-06-25 LAB — CMP14+EGFR
ALT: 18 IU/L (ref 0–32)
AST: 24 IU/L (ref 0–40)
Albumin/Globulin Ratio: 1.5 (ref 1.2–2.2)
Albumin: 4.3 g/dL (ref 3.7–4.7)
Alkaline Phosphatase: 93 IU/L (ref 44–121)
BUN/Creatinine Ratio: 16 (ref 12–28)
BUN: 16 mg/dL (ref 8–27)
Bilirubin Total: 0.4 mg/dL (ref 0.0–1.2)
CO2: 25 mmol/L (ref 20–29)
Calcium: 9.6 mg/dL (ref 8.7–10.3)
Chloride: 99 mmol/L (ref 96–106)
Creatinine, Ser: 0.98 mg/dL (ref 0.57–1.00)
Globulin, Total: 2.9 g/dL (ref 1.5–4.5)
Glucose: 96 mg/dL (ref 70–99)
Potassium: 4.4 mmol/L (ref 3.5–5.2)
Sodium: 138 mmol/L (ref 134–144)
Total Protein: 7.2 g/dL (ref 6.0–8.5)
eGFR: 59 mL/min/{1.73_m2} — ABNORMAL LOW (ref >59)

## 2021-06-25 LAB — CBC
Hematocrit: 42.6 % (ref 34.0–46.6)
Hemoglobin: 14.1 g/dL (ref 11.1–15.9)
MCH: 28.3 pg (ref 26.6–33.0)
MCHC: 33.1 g/dL (ref 31.5–35.7)
MCV: 85 fL (ref 79–97)
Platelets: 197 10*3/uL (ref 150–450)
RBC: 4.99 x10E6/uL (ref 3.77–5.28)
RDW: 13.8 % (ref 11.7–15.4)
WBC: 4.2 10*3/uL (ref 3.4–10.8)

## 2021-06-25 LAB — TSH: TSH: 3.39 u[IU]/mL (ref 0.450–4.500)

## 2021-06-25 LAB — VITAMIN D 25 HYDROXY (VIT D DEFICIENCY, FRACTURES): Vit D, 25-Hydroxy: 31.3 ng/mL (ref 30.0–100.0)

## 2021-09-11 ENCOUNTER — Ambulatory Visit: Payer: Self-pay | Admitting: *Deleted

## 2021-09-11 NOTE — Chronic Care Management (AMB) (Signed)
  Chronic Care Management   Note  09/11/2021 Name: Jodi Allen MRN: [id number] DOB: 29-Apr-1940   Due to changes in the Chronic Care Management program, I am removing myself as the RN Care Manager from the Care Team and closing any RN Care Management Care Plans. The patient has not worked with the Medical illustrator within the past 6 months. Patient was not scheduled to be followed by the RN Care Coordination nurse for [facility]   Patient does not have an open Care Plan with another CCM team member. Patient does not have a current CCM referral placed since 06/02/21. CCM enrollment status changed to "not enrolled".   Patient's PCP can place a new referral if the they needs Care Management or Care Coordination services in the future.  Demetrios Loll, BSN, RN-BC Engineer, materials Dial: [phone]

## 2021-09-11 NOTE — Patient Instructions (Signed)
Jodi Allen  At some point during the past 4 years, I have worked with you through the Chronic Care Management Program (CCM) at [facility]. We have not worked together within the past 6 months.   Due to program changes I am removing myself from your care team.   If you are currently active with another CCM Team Member, you will remain active with them unless they reach out to you with additional information.   If you feel that you need services in the future,  please talk with your primary care provider and request a new referral for Care Management or Care Coordination services. This does not affect your status as a patient at [facility].   Thank you for allowing me to participate in your your healthcare journey.  Demetrios Loll, BSN, RN-BC Engineer, materials Dial: [phone]

## 2021-12-24 ENCOUNTER — Encounter: Payer: Self-pay | Admitting: Family Medicine

## 2021-12-24 ENCOUNTER — Ambulatory Visit (INDEPENDENT_AMBULATORY_CARE_PROVIDER_SITE_OTHER): Payer: Medicare Other | Admitting: Family Medicine

## 2021-12-24 VITALS — BP 130/70 | HR 61 | Temp 98.2°F | Ht 64.0 in | Wt 217.2 lb

## 2021-12-24 DIAGNOSIS — M545 Low back pain, unspecified: Secondary | ICD-10-CM | POA: Diagnosis not present

## 2021-12-24 DIAGNOSIS — G8929 Other chronic pain: Secondary | ICD-10-CM | POA: Diagnosis not present

## 2021-12-24 DIAGNOSIS — Z636 Dependent relative needing care at home: Secondary | ICD-10-CM

## 2021-12-24 MED ORDER — TIZANIDINE HCL 4 MG PO TABS: 4.0000 mg | ORAL_TABLET | Freq: Three times a day (TID) | ORAL | 1 refills | Status: DC | PRN

## 2021-12-24 MED ORDER — BUSPIRONE HCL 5 MG PO TABS: 5.0000 mg | ORAL_TABLET | Freq: Two times a day (BID) | ORAL | 3 refills | Status: DC

## 2021-12-24 NOTE — Progress Notes (Signed)
Subjective: CC: Caregiver stress PCP: Raliegh Ip, DO FVC:[id number] Jodi Allen is a 81 y.o. female presenting to clinic today for:  1.  Caregiver stress Patient reports that her husband has what she thinks is dementia.  He has an appointment in [facility] for further evaluation soon.  He suffered with many medical illnesses including some heart failure over this summer.  His mood is labile and this stresses her out.  She feels like she has good support from her family but she worries about his illness becoming progressive as his sisters who suffer from advanced dementia   ROS: Per HPI  No Known Allergies Past Medical History:  Diagnosis Date   GERD (gastroesophageal reflux disease)    Hyperlipidemia    Osteopenia    Osteoporosis     Current Outpatient Medications:    Cholecalciferol 1.25 MG (50000 UT) capsule, Take 1 capsule (50,000 Units total) by mouth every 7 (seven) days., Disp: 12 capsule, Rfl: 3   diclofenac Sodium (VOLTAREN) 1 % GEL, Apply 4 g topically 4 (four) times daily. (knee pain).  If not covered please show her where the OTC version is (Patient not taking: Reported on 06/24/2021), Disp: 400 g, Rfl: 2   rosuvastatin (CRESTOR) 10 MG tablet, Take 1 tablet (10 mg total) by mouth daily., Disp: 90 tablet, Rfl: 3   tiZANidine (ZANAFLEX) 4 MG tablet, Take 1 tablet (4 mg total) by mouth every 8 (eight) hours as needed for muscle spasms., Disp: 30 tablet, Rfl: 0 Social History   Socioeconomic History   Marital status: Married    Spouse name: Dimas Aguas   Number of children: 6   Years of education: 11   Highest education level: 11th grade  Occupational History   Occupation: retired    Associate Professor: [employer]  Tobacco Use   Smoking status: Never   Smokeless tobacco: Never  Vaping Use   Vaping Use: Never used  Substance and Sexual Activity   Alcohol use: No   Drug use: No   Sexual activity: Yes  Other Topics Concern   Not on file  Social History Narrative   Not on  file   Social Determinants of Health   Financial Resource Strain: Low Risk  (04/21/2021)   Overall Financial Resource Strain (CARDIA)    Difficulty of Paying Living Expenses: Not hard at all  Food Insecurity: No Food Insecurity (04/21/2021)   Hunger Vital Sign    Worried About Running Out of Food in the Last Year: Never true    Ran Out of Food in the Last Year: Never true  Transportation Needs: No Transportation Needs (04/21/2021)   PRAPARE - Administrator, Civil Service (Medical): No    Lack of Transportation (Non-Medical): No  Physical Activity: Insufficiently Active (04/21/2021)   Exercise Vital Sign    Days of Exercise per Week: 2 days    Minutes of Exercise per Session: 60 min  Stress: No Stress Concern Present (04/21/2021)   [employer] of Occupational Health - Occupational Stress Questionnaire    Feeling of Stress : Only a little  Social Connections: Socially Integrated (04/21/2021)   Social Connection and Isolation Panel [NHANES]    Frequency of Communication with Friends and Family: More than three times a week    Frequency of Social Gatherings with Friends and Family: More than three times a week    Attends Religious Services: More than 4 times per year    Active Member of Clubs or Organizations: Yes  Attends Banker Meetings: More than 4 times per year    Marital Status: Married  Catering manager Violence: Not At Risk (04/21/2021)   Humiliation, Afraid, Rape, and Kick questionnaire    Fear of Current or Ex-Partner: No    Emotionally Abused: No    Physically Abused: No    Sexually Abused: No   Family History  Problem Relation Age of Onset   Cancer Mother        BREAST REMOVED   Breast cancer Mother    Leukemia Sister    Bladder Cancer Brother    Cancer Son    Arthritis Brother     Objective: Office vital signs reviewed. BP 130/70   Pulse 61   Temp 98.2 F (36.8 C)   Ht 5\' 4"  (1.626 Jodi)   Wt 217 lb 3.2 oz (98.5 kg)   SpO2 97%    BMI 37.28 kg/Jodi   Physical Examination:  General: Awake, alert, well nourished, No acute distress HEENT: sclera white, MMM Cardio: regular rate and rhythm, S1S2 heard, no murmurs appreciated Pulm: clear to auscultation bilaterally, no wheezes, rhonchi or rales; normal work of breathing on room air Extremities: No edema Psych: Somewhat anxious appearing but overall very pleasant, interactive     04/21/2021    9:50 AM 12/24/2020    1:40 PM 07/30/2020    8:47 AM  Depression screen PHQ 2/9  Decreased Interest 0 0 0  Down, Depressed, Hopeless 1 1 0  PHQ - 2 Score 1 1 0  Altered sleeping  0   Tired, decreased energy  1   Change in appetite  1   Feeling bad or failure about yourself   0   Trouble concentrating  0   Moving slowly or fidgety/restless  1   Suicidal thoughts  0   PHQ-9 Score  4       12/24/2020    1:41 PM 07/30/2020    8:47 AM  GAD 7 : Generalized Anxiety Score  Nervous, Anxious, on Edge 0 0  Control/stop worrying 0 0  Worry too much - different things 0 0  Trouble relaxing 0 0  Restless 0 0  Easily annoyed or irritable 0 0  Afraid - awful might happen 0 0  Total GAD 7 Score 0 0  Anxiety Difficulty Somewhat difficult Not difficult at all   Assessment/ Plan: 81 y.o. female   Caregiver stress - Plan: busPIRone (BUSPAR) 5 MG tablet  Chronic right-sided low back pain without sciatica - Plan: tiZANidine (ZANAFLEX) 4 MG tablet  Start buspirone 5 mg twice daily.  Will advance if needed.  I gave her handout today on caregiver stress and pointers in the setting of family members with dementia.  We will plan to reconvene in 3 months rather than 6 months to follow-up how she is doing  Back pain is intermittent and controlled with tizanidine.  This has been renewed.  No orders of the defined types were placed in this encounter.  No orders of the defined types were placed in this encounter.    78, DO [facility] [phone]

## 2021-12-24 NOTE — Patient Instructions (Signed)
Dementia Caregiver Guide Dementia is a term used to describe a number of symptoms that affect memory and thinking. The most common symptoms include: Memory loss. Trouble with language and communication. Trouble concentrating. Poor judgment and problems with reasoning. Wandering from home or public places. Extreme anxiety or depression. Being suspicious or having angry outbursts and accusations. Child-like behavior and language. Dementia can be frightening and confusing. And taking care of someone with dementia can be challenging. This guide provides tips to help you when providing care for a person with dementia. How to help manage lifestyle changes Dementia usually gets worse slowly over time. In the early stages, people with dementia can stay independent and safe with some help. In later stages, they need help with daily tasks such as dressing, grooming, and using the bathroom. There are actions you can take to help a person manage his or her life while living with this condition. Communicating When the person is talking or seems frustrated, make eye contact and hold the person's hand. Ask specific questions that need yes or no answers. Use simple words, short sentences, and a calm voice. Only give one direction at a time. When offering choices, limit the person to just one or two. Avoid correcting the person in a negative way. If the person is struggling to find the right words, gently try to help him or her. Preventing injury  Keep floors clear of clutter. Remove rugs, magazine racks, and floor lamps. Keep hallways well lit, especially at night. Put a handrail and nonslip mat in the bathtub or shower. Put childproof locks on cabinets that contain dangerous items, such as medicines, alcohol, guns, toxic cleaning items, sharp tools or utensils, matches, and lighters. For doors to the outside of the house, put the locks in places where the person cannot see or reach them easily. This will  help ensure that the person does not wander out of the house and get lost. Be prepared for emergencies. Keep a list of emergency phone numbers and addresses in a convenient area. Remove car keys and lock garage doors so that the person does not try to get in the car and drive. Have the person wear a bracelet that tracks locations and identifies the person as having memory problems. This should be worn at all times for safety. Helping with daily life  Keep the person on track with his or her routine. Try to identify areas where the person may need help. Be supportive, patient, calm, and encouraging. Gently remind the person that adjusting to changes takes time. Help with the tasks that the person has asked for help with. Keep the person involved in daily tasks and decisions as much as possible. Encourage conversation, but try not to get frustrated if the person struggles to find words or does not seem to appreciate your help. How to recognize stress Look for signs of stress in yourself and in the person you are caring for. If you notice signs of stress, take steps to manage it. Symptoms of stress include: Feeling anxious, irritable, frustrated, or angry. Denying that the person has dementia or that his or her symptoms will not improve. Feeling depressed, hopeless, or unappreciated. Difficulty sleeping. Difficulty concentrating. Developing stress-related health problems. Feeling like you have too little time for your own life. Follow these instructions at home: Take care of your health Make sure that you and the person you are caring for: Get regular sleep. Exercise regularly. Eat regular, nutritious meals. Take over-the-counter and prescription medicines only   as told by your health care providers. Drink enough fluid to keep your urine pale yellow. Attend all scheduled health care appointments.  General instructions Join a support group with others who are caregivers. Ask about  respite care resources. Respite care can provide short-term care for the person so that you can have a regular break from the stress of caregiving. Consider any safety risks and take steps to avoid them. Organize medicines in a pill box for each day of the week. Create a plan to handle any legal or financial matters. Get legal or financial advice if needed. Keep a calendar in a central location to remind the person of appointments or other activities. Where to find support: Many individuals and organizations offer support. These include: Support groups for people with dementia. Support groups for caregivers. Counselors or therapists. Home health care services. Adult day care centers. Where to find more information Centers for Disease Control and Prevention: www.cdc.gov Alzheimer's Association: www.alz.org Family Caregiver Alliance: www.caregiver.org Alzheimer's Foundation of America: www.alzfdn.org Contact a health care provider if: The person's health is rapidly getting worse. You are no longer able to care for the person. Caring for the person is affecting your physical and emotional health. You are feeling depressed or anxious about caring for the person. Get help right away if: The person threatens himself or herself, you, or anyone else. You feel depressed or sad, or feel that you want to harm yourself. If you ever feel like your loved one may hurt himself or herself or others, or if he or she shares thoughts about taking his or her own life, get help right away. You can go to your nearest emergency department or: Call your local emergency services (911 in the [place] Call a suicide crisis helpline, such as the National Suicide Prevention Lifeline at 1-800-273-8255 or 988 in the [place] This is open 24 hours a day in the [place] Text the Crisis Text Line at 741741 (in the [place] Summary Dementia is a term used to describe a number of symptoms that affect memory and thinking. Dementia  usually gets worse slowly over time. Take steps to reduce the person's risk of injury and to plan for future care. Caregivers need support, relief from caregiving, and time for their own lives. This information is not intended to replace advice given to you by your health care provider. Make sure you discuss any questions you have with your health care provider. Document Revised: 08/14/2020 Document Reviewed: 06/05/2019 Elsevier Patient Education  2023 Elsevier Inc.  

## 2022-03-31 ENCOUNTER — Ambulatory Visit (INDEPENDENT_AMBULATORY_CARE_PROVIDER_SITE_OTHER): Payer: Medicare Other | Admitting: Family Medicine

## 2022-03-31 ENCOUNTER — Encounter: Payer: Self-pay | Admitting: Family Medicine

## 2022-03-31 VITALS — BP 138/76 | HR 75 | Temp 98.6°F | Ht 64.0 in | Wt 216.0 lb

## 2022-03-31 DIAGNOSIS — M8589 Other specified disorders of bone density and structure, multiple sites: Secondary | ICD-10-CM

## 2022-03-31 DIAGNOSIS — Z636 Dependent relative needing care at home: Secondary | ICD-10-CM

## 2022-03-31 DIAGNOSIS — E559 Vitamin D deficiency, unspecified: Secondary | ICD-10-CM | POA: Diagnosis not present

## 2022-03-31 DIAGNOSIS — M25572 Pain in left ankle and joints of left foot: Secondary | ICD-10-CM

## 2022-03-31 NOTE — Progress Notes (Signed)
Subjective: CC:f/u caregiver stress PCP: Jodi Norlander, DO [id number] Jodi Allen is a 82 y.o. female presenting to clinic today for:  1. Caregiver stress Started on Buspar in November.  She did find this to be very helpful but at this point she is decided just to discontinue the medication because she is able to deal with things on her own.  She worries that her husband has a component of dementia in addition to the heart failure he has been experiencing.  However, she feels like she is navigating these issues independently and in doing okay without medications.  2.  Left heel pain Patient does report some left lateral ankle/heel pain.  There is no preceding injury.  Sometimes she gets some soft tissue swelling in that area.  Has not utilize any treatment for this but wanted to get some recommendations today  3.  Osteopenia History of vitamin D deficiency.  Compliant with vitamin D.  No reports of bony pain.   ROS: Per HPI  No Known Allergies Past Medical History:  Diagnosis Date   GERD (gastroesophageal reflux disease)    Hyperlipidemia    Osteopenia    Osteoporosis     Current Outpatient Medications:    busPIRone (BUSPAR) 5 MG tablet, Take 1 tablet (5 mg total) by mouth 2 (two) times daily. For stress/anxiety, Disp: 180 tablet, Rfl: 3   Cholecalciferol 1.25 MG (50000 UT) capsule, Take 1 capsule (50,000 Units total) by mouth every 7 (seven) days., Disp: 12 capsule, Rfl: 3   diclofenac Sodium (VOLTAREN) 1 % GEL, Apply 4 g topically 4 (four) times daily. (knee pain).  If not covered please show her where the OTC version is, Disp: 400 g, Rfl: 2   rosuvastatin (CRESTOR) 10 MG tablet, Take 1 tablet (10 mg total) by mouth daily., Disp: 90 tablet, Rfl: 3   tiZANidine (ZANAFLEX) 4 MG tablet, Take 1 tablet (4 mg total) by mouth every 8 (eight) hours as needed for muscle spasms., Disp: 90 tablet, Rfl: 1 Social History   Socioeconomic History   Marital status: Married    Spouse  name: Nadara Mustard   Number of children: 6   Years of education: 11   Highest education level: 11th grade  Occupational History   Occupation: retired    Fish farm manager: [employer]  Tobacco Use   Smoking status: Never   Smokeless tobacco: Never  Vaping Use   Vaping Use: Never used  Substance and Sexual Activity   Alcohol use: No   Drug use: No   Sexual activity: Yes  Other Topics Concern   Not on file  Social History Narrative   Not on file   Social Determinants of Health   Financial Resource Strain: Low Risk  (04/21/2021)   Overall Financial Resource Strain (CARDIA)    Difficulty of Paying Living Expenses: Not hard at all  Food Insecurity: No Food Insecurity (04/21/2021)   Hunger Vital Sign    Worried About Running Out of Food in the Last Year: Never true    [place] in the Last Year: Never true  Transportation Needs: No Transportation Needs (04/21/2021)   PRAPARE - Hydrologist (Medical): No    Lack of Transportation (Non-Medical): No  Physical Activity: Insufficiently Active (04/21/2021)   Exercise Vital Sign    Days of Exercise per Week: 2 days    Minutes of Exercise per Session: 60 min  Stress: No Stress Concern Present (04/21/2021)   [employer] of Occupational  Health - Occupational Stress Questionnaire    Feeling of Stress : Only a little  Social Connections: Socially Integrated (04/21/2021)   Social Connection and Isolation Panel [NHANES]    Frequency of Communication with Friends and Family: More than three times a week    Frequency of Social Gatherings with Friends and Family: More than three times a week    Attends Religious Services: More than 4 times per year    Active Member of [employer] or Organizations: Yes    Attends Music therapist: More than 4 times per year    Marital Status: Married  Human resources officer Violence: Not At Risk (04/21/2021)   Humiliation, Afraid, Rape, and Kick questionnaire    Fear of Current or Ex-Partner:  No    Emotionally Abused: No    Physically Abused: No    Sexually Abused: No   Family History  Problem Relation Age of Onset   Cancer Mother        BREAST REMOVED   Breast cancer Mother    Leukemia Sister    Bladder Cancer Brother    Cancer Son    Arthritis Brother     Objective: Office vital signs reviewed. BP 138/76   Pulse 75   Temp 98.6 F (37 C)   Ht '5\' 4"'$  (1.626 Jodi)   Wt 216 lb (98 kg)   SpO2 99%   BMI 37.08 kg/Jodi   Physical Examination:  General: Awake, alert, well nourished, No acute distress HEENT: sclera white, MMM Cardio: regular rate and rhythm, S1S2 heard, no murmurs appreciated Pulm: clear to auscultation bilaterally, no wheezes, rhonchi or rales; normal work of breathing on room air MSK: Ambulating independently.  She has some tenderness to palpation along the posterior lateral ankle on the left.  There are no palpable deformities.  She has full flexion and extension of the foot. Psych: Very pleasant, interactive female with good eye contact.    03/31/2022   10:17 AM 12/24/2021   11:23 AM 04/21/2021    9:50 AM  Depression screen PHQ 2/9  Decreased Interest 0 0 0  Down, Depressed, Hopeless 0 0 1  PHQ - 2 Score 0 0 1  Altered sleeping 0 0   Tired, decreased energy 0 1   Change in appetite 0 0   Feeling bad or failure about yourself  0 0   Trouble concentrating 0 0   Moving slowly or fidgety/restless 0 0   Suicidal thoughts 0 0   PHQ-9 Score 0 1   Difficult doing work/chores Not difficult at all Not difficult at all       03/31/2022   10:17 AM 12/24/2021   11:23 AM 12/24/2020    1:41 PM 07/30/2020    8:47 AM  GAD 7 : Generalized Anxiety Score  Nervous, Anxious, on Edge 0 0 0 0  Control/stop worrying 0 0 0 0  Worry too much - different things 0 0 0 0  Trouble relaxing 0 1 0 0  Restless 0 0 0 0  Easily annoyed or irritable 0 0 0 0  Afraid - awful might happen 0 0 0 0  Total GAD 7 Score 0 1 0 0  Anxiety Difficulty Not difficult at all Not  difficult at all Somewhat difficult Not difficult at all   Assessment/ Plan: 82 y.o. female   Caregiver stress  Osteopenia of multiple sites - Plan: VITAMIN D 25 Hydroxy (Vit-D Deficiency, Fractures), CMP14+EGFR  Vitamin D deficiency - Plan: VITAMIN D 25 Hydroxy (  Vit-D Deficiency, Fractures), CMP14+EGFR  Left lateral ankle pain  Doing well without medications but did respond to BuSpar so we may revisit that if needed going forward.  Check vitamin D level, calcium level.  Not yet due for DEXA scan  Suspect ligamentous irritation of that left lateral ankle.  Home physical therapy exercises provided to the patient.  I encouraged her to utilize topical diclofenac gel if needed.  Consider icing the area.  No orders of the defined types were placed in this encounter.  No orders of the defined types were placed in this encounter.    Jodi Norlander, DO [facility] [phone]

## 2022-04-01 LAB — VITAMIN D 25 HYDROXY (VIT D DEFICIENCY, FRACTURES): Vit D, 25-Hydroxy: 33.5 ng/mL (ref 30.0–100.0)

## 2022-04-01 LAB — CMP14+EGFR
ALT: 13 IU/L (ref 0–32)
AST: 20 IU/L (ref 0–40)
Albumin/Globulin Ratio: 1.6 (ref 1.2–2.2)
Albumin: 4.4 g/dL (ref 3.8–4.8)
Alkaline Phosphatase: 98 IU/L (ref 44–121)
BUN/Creatinine Ratio: 15 (ref 12–28)
BUN: 15 mg/dL (ref 8–27)
Bilirubin Total: 0.5 mg/dL (ref 0.0–1.2)
CO2: 23 mmol/L (ref 20–29)
Calcium: 9.4 mg/dL (ref 8.7–10.3)
Chloride: 102 mmol/L (ref 96–106)
Creatinine, Ser: 0.98 mg/dL (ref 0.57–1.00)
Globulin, Total: 2.7 g/dL (ref 1.5–4.5)
Glucose: 86 mg/dL (ref 70–99)
Potassium: 4.2 mmol/L (ref 3.5–5.2)
Sodium: 140 mmol/L (ref 134–144)
Total Protein: 7.1 g/dL (ref 6.0–8.5)
eGFR: 59 mL/min/{1.73_m2} — ABNORMAL LOW (ref >59)

## 2022-04-02 ENCOUNTER — Telehealth: Payer: Self-pay

## 2022-04-02 NOTE — Telephone Encounter (Signed)
Attempted to call pt , no answer - lab letter sent

## 2022-04-07 NOTE — Telephone Encounter (Signed)
Attempted to call pt no answer  

## 2022-04-07 NOTE — Telephone Encounter (Signed)
Please call back. Charlotte-daughter-is with pt now but may not be when nurse r/c. She is away that pt must be present before releasing any information. Please call back

## 2022-04-15 ENCOUNTER — Encounter: Payer: Self-pay | Admitting: Family Medicine

## 2022-04-15 ENCOUNTER — Ambulatory Visit (INDEPENDENT_AMBULATORY_CARE_PROVIDER_SITE_OTHER): Payer: Medicare Other | Admitting: Family Medicine

## 2022-04-15 VITALS — BP 131/66 | HR 80 | Temp 97.9°F | Ht 64.0 in | Wt 217.0 lb

## 2022-04-15 DIAGNOSIS — N1831 Chronic kidney disease, stage 3a: Secondary | ICD-10-CM | POA: Insufficient documentation

## 2022-04-15 MED ORDER — DAPAGLIFLOZIN PROPANEDIOL 5 MG PO TABS: 5.0000 mg | ORAL_TABLET | Freq: Every day | ORAL | 12 refills | Status: DC

## 2022-04-15 NOTE — Patient Instructions (Addendum)
Start Jodi Allen daily.  Come in for labs in 2 weeks.  I'd like you to see Almyra Free (our pharmacist here) in 1 month for patient assistance if that copay is really high.  Chronic Kidney Disease, Adult Chronic kidney disease (CKD) occurs when the kidneys are slowly and permanently damaged over a long period of time. The kidneys are a pair of organs that do many important jobs in the body, including: Removing waste and extra fluid from the blood to make urine. Making hormones that maintain the amount of fluid in tissues and blood vessels. Maintaining the right amount of fluids and chemicals in the body. A small amount of kidney damage may not cause problems, but a large amount of damage may make it hard or impossible for the kidneys to work right. Steps must be taken to slow kidney damage or to stop it from getting worse. If steps are not taken, the kidneys may stop working permanently (end-stage renal disease, or ESRD). Most of the time, CKD does not go away, but it can often be controlled. People who have CKD are usually able to live full lives. What are the causes? The most common causes of this condition are diabetes and high blood pressure (hypertension). Other causes include: Cardiovascular diseases. These affect the heart and blood vessels. Kidney diseases. These include: Glomerulonephritis, or inflammation of the tiny filters in the kidneys. Interstitial nephritis. This is swelling of the small tubes of the kidneys and of the surrounding structures. Polycystic kidney disease, in which clusters of fluid-filled sacs form within the kidneys. Renal vascular disease. This includes disorders that affect the arteries and veins of the kidneys. Diseases that affect the body's defense system (immune system). A problem with urine flow. This may be caused by: Kidney stones. Cancer. An enlarged prostate, in males. A kidney infection or urinary tract infection (UTI) that keeps coming back. Vasculitis. This  is swelling or inflammation of the blood vessels. What increases the risk? Your chances of having kidney disease increase with age. The following factors may make you more likely to develop this condition: A family history of kidney disease or kidney failure. Kidney failure means the kidneys can no longer work right. Certain genetic diseases. Taking medicines often that are damaging to the kidneys. Being around or being in contact with toxic substances. Obesity. A history of tobacco use. What are the signs or symptoms? Symptoms of this condition include: Feeling very tired (lethargic) and having less energy. Swelling, or edema, of the face, legs, ankles, or feet. Nausea or vomiting, or loss of appetite. Confusion or trouble concentrating. Muscle twitches and cramps, especially in the legs. Dry, itchy skin. A metallic taste in the mouth. Producing less urine, or producing more urine (especially at night). Shortness of breath. Trouble sleeping. CKD may also result in not having enough red blood cells or hemoglobin in the blood (anemia) or having weak bones (bone disease). Symptoms develop slowly and may not be obvious until the kidney damage becomes severe. It is possible to have kidney disease for years without having symptoms. How is this diagnosed? This condition may be diagnosed based on: Blood tests. Urine tests. Imaging tests, such as an ultrasound or a CT scan. A kidney biopsy. This involves removing a sample of kidney tissue to be looked at under a microscope. Results from these tests will help to determine how serious the CKD is. How is this treated? There is no cure for most cases of this condition, but treatment usually relieves symptoms and  prevents or slows the worsening of the disease. Treatment may include: Diet changes, which may require you to avoid alcohol and foods that are high in salt, potassium, phosphorous, and protein. Medicines. These may: Lower blood  pressure. Control blood sugar (glucose). Relieve anemia. Relieve swelling. Protect your bones. Improve the balance of salts and minerals in your blood (electrolytes). Dialysis, which is a type of treatment that removes toxic waste from the body. It may be needed if you have kidney failure. Managing any other conditions that are causing your CKD or making it worse. Follow these instructions at home: Medicines Take over-the-counter and prescription medicines only as told by your health care provider. The amount of some medicines that you take may need to be changed. Do not take any new medicines unless approved by your health care provider. Many medicines can make kidney damage worse. Do not take any vitamin and mineral supplements unless approved by your health care provider. Many nutritional supplements can make kidney damage worse. Lifestyle  Do not use any products that contain nicotine or tobacco, such as cigarettes, e-cigarettes, and chewing tobacco. If you need help quitting, ask your health care provider. If you drink alcohol: Limit how much you use to: 0-1 drink a day for women who are not pregnant. 0-2 drinks a day for men. Know how much alcohol is in your drink. In the [place] one drink equals one 12 oz bottle of beer (355 mL), one 5 oz glass of wine (148 mL), or one 1 oz glass of hard liquor (44 mL). Maintain a healthy weight. If you need help, ask your health care provider. General instructions  Follow instructions from your health care provider about eating or drinking restrictions, including any prescribed diet. Track your blood pressure at home. Report changes in your blood pressure as told. If you are being treated for diabetes, track your blood glucose levels as told. Start or continue an exercise plan. Exercise at least 30 minutes a day, 5 days a week. Keep your immunizations up to date as told. Keep all follow-up visits. This is important. Where to find more  information American Association of Kidney Patients: BombTimer.gl National Kidney Foundation: www.kidney.[place]: https://mathis.com/ Life Options: www.lifeoptions.org Kidney School: www.kidneyschool.org Contact a health care provider if: Your symptoms get worse. You develop new symptoms. Get help right away if: You develop symptoms of ESRD. These include: Headaches. Numbness in your hands or feet. Easy bruising. Frequent hiccups. Chest pain. Shortness of breath. Lack of menstrual periods, in women. You have a fever. You are producing less urine than usual. You have pain or bleeding when you urinate or when you have a bowel movement. These symptoms may represent a serious problem that is an emergency. Do not wait to see if the symptoms will go away. Get medical help right away. Call your local emergency services (911 in the [place] Do not drive yourself to the hospital. Summary Chronic kidney disease (CKD) occurs when the kidneys become damaged slowly over a long period of time. The most common causes of this condition are diabetes and high blood pressure (hypertension). There is no cure for most cases of CKD, but treatment usually relieves symptoms and prevents or slows the worsening of the disease. Treatment may include a combination of lifestyle changes, medicines, and dialysis. This information is not intended to replace advice given to you by your health care provider. Make sure you discuss any questions you have with your health care provider. Document Revised: 04/26/2019 Document  Reviewed: 04/26/2019 Elsevier Patient Education  [place].

## 2022-04-15 NOTE — Progress Notes (Signed)
Subjective: CC:New onset CKD PCP: Janora Norlander, DO [id number] Jodi Allen is a 82 y.o. female presenting to clinic today for:  1. CKD3a  patient now with 3 laboratory draws which shows GFR of less than 60.  New onset CKD 3A.  Discussed this.  She is here to further discuss.  No NSAID use.  Trying to hydrate well.  No known family history of renal disease.  Not treated for HTN or DM.  Reports good urine output.   ROS: Per HPI  No Known Allergies Past Medical History:  Diagnosis Date   GERD (gastroesophageal reflux disease)    Hyperlipidemia    Osteopenia    Osteoporosis     Current Outpatient Medications:    busPIRone (BUSPAR) 5 MG tablet, Take 1 tablet (5 mg total) by mouth 2 (two) times daily. For stress/anxiety, Disp: 180 tablet, Rfl: 3   Cholecalciferol 1.25 MG (50000 UT) capsule, Take 1 capsule (50,000 Units total) by mouth every 7 (seven) days., Disp: 12 capsule, Rfl: 3   dapagliflozin propanediol (FARXIGA) 5 MG TABS tablet, Take 1 tablet (5 mg total) by mouth daily before breakfast., Disp: 30 tablet, Rfl: 12   diclofenac Sodium (VOLTAREN) 1 % GEL, Apply 4 g topically 4 (four) times daily. (knee pain).  If not covered please show her where the OTC version is, Disp: 400 g, Rfl: 2   rosuvastatin (CRESTOR) 10 MG tablet, Take 1 tablet (10 mg total) by mouth daily., Disp: 90 tablet, Rfl: 3   tiZANidine (ZANAFLEX) 4 MG tablet, Take 1 tablet (4 mg total) by mouth every 8 (eight) hours as needed for muscle spasms., Disp: 90 tablet, Rfl: 1 Social History   Socioeconomic History   Marital status: Married    Spouse name: Nadara Mustard   Number of children: 6   Years of education: 11   Highest education level: 11th grade  Occupational History   Occupation: retired    Fish farm manager: [employer]  Tobacco Use   Smoking status: Never   Smokeless tobacco: Never  Vaping Use   Vaping Use: Never used  Substance and Sexual Activity   Alcohol use: No   Drug use: No   Sexual activity: Yes   Other Topics Concern   Not on file  Social History Narrative   Not on file   Social Determinants of Health   Financial Resource Strain: Low Risk  (04/21/2021)   Overall Financial Resource Strain (CARDIA)    Difficulty of Paying Living Expenses: Not hard at all  Food Insecurity: No Food Insecurity (04/21/2021)   Hunger Vital Sign    Worried About Running Out of Food in the Last Year: Never true    [place] in the Last Year: Never true  Transportation Needs: No Transportation Needs (04/21/2021)   PRAPARE - Hydrologist (Medical): No    Lack of Transportation (Non-Medical): No  Physical Activity: Insufficiently Active (04/21/2021)   Exercise Vital Sign    Days of Exercise per Week: 2 days    Minutes of Exercise per Session: 60 min  Stress: No Stress Concern Present (04/21/2021)   [place]    Feeling of Stress : Only a little  Social Connections: Socially Integrated (04/21/2021)   Social Connection and Isolation Panel [NHANES]    Frequency of Communication with Friends and Family: More than three times a week    Frequency of Social Gatherings with Friends and Family: More than three  times a week    Attends Religious Services: More than 4 times per year    Active Member of Clubs or Organizations: Yes    Attends Archivist Meetings: More than 4 times per year    Marital Status: Married  Human resources officer Violence: Not At Risk (04/21/2021)   Humiliation, Afraid, Rape, and Kick questionnaire    Fear of Current or Ex-Partner: No    Emotionally Abused: No    Physically Abused: No    Sexually Abused: No   Family History  Problem Relation Age of Onset   Cancer Mother        BREAST REMOVED   Breast cancer Mother    Leukemia Sister    Bladder Cancer Brother    Cancer Son    Arthritis Brother     Objective: Office vital signs reviewed. BP 131/66   Pulse 80   Temp 97.9 F  (36.6 C)   Ht '5\' 4"'$  (1.626 Jodi)   Wt 217 lb (98.4 kg)   SpO2 98%   BMI 37.25 kg/Jodi   Physical Examination:  General: Awake, alert, well nourished, No acute distress HEENT: sclera white, MMM Cardio: regular rate and rhythm, S1S2 heard, no murmurs appreciated Pulm: clear to auscultation bilaterally, no wheezes, rhonchi or rales; normal work of breathing on room air Ext: no edema.    Assessment/ Plan: 82 y.o. female   Chronic kidney disease, stage 3a ([place]) - Plan: dapagliflozin propanediol (FARXIGA) 5 MG TABS tablet, AMB Referral to Chronic Care Management Services  Discussed possible etiologies.  For now, she wants to hold off on renal referral.  Start [name].  Referral to CCM for possible PAP and new diagnosis assistance/ education.  Discussed avoidance of NSAIDs. Adequate hydration/ balanced nutrition and adequate exercise.  Will consider adding ACE-I/ ARB pending response to [place].  BMP in 2 weeks.  Handout provided today  Orders Placed This Encounter  Procedures   AMB Referral to Chronic Care Management Services    Referral Priority:   Routine    Referral Type:   Consultation    Referral Reason:   Chronic Care Management (CCM)    Number of Visits Requested:   1   Meds ordered this encounter  Medications   dapagliflozin propanediol (FARXIGA) 5 MG TABS tablet    Sig: Take 1 tablet (5 mg total) by mouth daily before breakfast.    Dispense:  30 tablet    Refill:  [place], DO [place] [phone]

## 2022-04-17 ENCOUNTER — Telehealth: Payer: Self-pay

## 2022-04-17 NOTE — Progress Notes (Signed)
  Chronic Care Management   Note  04/17/2022 Name: Jodi Allen MRN: [id number] DOB: Dec 04, 1940  Jodi Allen is a 82 y.o. year old female who is a primary care patient of Janora Norlander, DO. I reached out to [place] by phone today in response to a referral sent by [name]'s PCP.  The first contact attempt was unsuccessful.   Follow up plan: Additional outreach attempts will be made.  Noreene Larsson, [place], [place] [address] Direct Dial: [phone] [email or web address]

## 2022-04-24 NOTE — Progress Notes (Signed)
  Chronic Care Management   Note  04/24/2022 Name: Jodi Allen MRN: [id number] DOB: [birth year]  [name] is a 82 y.o. year old female who is a primary care patient of Janora Norlander, DO. I reached out to [place] by phone today in response to a referral sent by Ms. Cristopher Peru Pecora's PCP.  Ms. Baro was given information about Chronic Care Management services today including:  CCM service includes personalized support from designated clinical staff supervised by the physician, including individualized plan of care and coordination with other care providers 24/7 contact phone numbers for assistance for urgent and routine care needs. Service will only be billed when office clinical staff spend 20 minutes or more in a month to coordinate care. Only one practitioner may furnish and bill the service in a calendar month. The patient may stop CCM services at amy time (effective at the end of the month) by phone call to the office staff. The patient will be responsible for cost sharing (co-pay) or up to 20% of the service fee (after annual deductible is met)  Ms. Saphronia Thistle Cerros  agreedto scheduling an appointment with the CCM RN Case Manager   Follow up plan: Patient agreed to scheduled appointment with RN Case Manager on 05/04/2022 Pharm d 05/19/2022(date/time).   Noreene Larsson, [place], [place] [address] Direct Dial: [phone] [email or web address]

## 2022-04-27 ENCOUNTER — Ambulatory Visit (INDEPENDENT_AMBULATORY_CARE_PROVIDER_SITE_OTHER): Payer: Medicare Other

## 2022-04-27 VITALS — Ht 64.0 in | Wt 217.0 lb

## 2022-04-27 DIAGNOSIS — Z0000 Encounter for general adult medical examination without abnormal findings: Secondary | ICD-10-CM

## 2022-04-27 NOTE — Progress Notes (Signed)
Subjective:   Jodi Allen is a 82 y.o. female who presents for Medicare Annual (Subsequent) preventive examination. I connected with  Jodi Allen on 04/27/22 by a audio enabled telemedicine application and verified that I am speaking with the correct person using two identifiers.  Patient Location: Home  Provider Location: Home Office  I discussed the limitations of evaluation and management by telemedicine. The patient expressed understanding and agreed to proceed.  Review of Systems     Cardiac Risk Factors include: advanced age (>92men, >73 women);hypertension     Objective:    Today's Vitals   04/27/22 0950  Weight: 217 lb (98.4 kg)  Height: 5\' 4"  (1.626 m)   Body mass index is 37.25 kg/m.     04/27/2022    9:54 AM 04/21/2021    9:53 AM 02/16/2020   11:23 AM 09/23/2018    8:33 AM  Advanced Directives  Does Patient Have a Medical Advance Directive? No No No No  Would patient like information on creating a medical advance directive? No - Patient declined No - Patient declined No - Patient declined No - Patient declined    Current Medications (verified) Outpatient Encounter Medications as of 04/27/2022  Medication Sig   busPIRone (BUSPAR) 5 MG tablet Take 1 tablet (5 mg total) by mouth 2 (two) times daily. For stress/anxiety   Cholecalciferol 1.25 MG (50000 UT) capsule Take 1 capsule (50,000 Units total) by mouth every 7 (seven) days.   dapagliflozin propanediol (FARXIGA) 5 MG TABS tablet Take 1 tablet (5 mg total) by mouth daily before breakfast.   diclofenac Sodium (VOLTAREN) 1 % GEL Apply 4 g topically 4 (four) times daily. (knee pain).  If not covered please show her where the OTC version is   rosuvastatin (CRESTOR) 10 MG tablet Take 1 tablet (10 mg total) by mouth daily.   tiZANidine (ZANAFLEX) 4 MG tablet Take 1 tablet (4 mg total) by mouth every 8 (eight) hours as needed for muscle spasms.   No facility-administered encounter medications on file as of  04/27/2022.    Allergies (verified) Patient has no known allergies.   History: Past Medical History:  Diagnosis Date   GERD (gastroesophageal reflux disease)    Hyperlipidemia    Osteopenia    Osteoporosis    Past Surgical History:  Procedure Laterality Date   ABDOMINAL HYSTERECTOMY     Family History  Problem Relation Age of Onset   Cancer Mother        BREAST REMOVED   Breast cancer Mother    Leukemia Sister    Bladder Cancer Brother    Cancer Son    Arthritis Brother    Social History   Socioeconomic History   Marital status: Married    Spouse name: Nadara Mustard   Number of children: 6   Years of education: 11   Highest education level: 11th grade  Occupational History   Occupation: retired    Fish farm manager: [employer]  Tobacco Use   Smoking status: Never   Smokeless tobacco: Never  Vaping Use   Vaping Use: Never used  Substance and Sexual Activity   Alcohol use: No   Drug use: No   Sexual activity: Yes  Other Topics Concern   Not on file  Social History Narrative   Not on file   Social Determinants of Health   Financial Resource Strain: Low Risk  (04/27/2022)   Overall Financial Resource Strain (CARDIA)    Difficulty of Paying Living Expenses: Not hard at  all  Food Insecurity: No Food Insecurity (04/27/2022)   Hunger Vital Sign    Worried About Running Out of Food in the Last Year: Never true    Ran Out of Food in the Last Year: Never true  Transportation Needs: No Transportation Needs (04/27/2022)   PRAPARE - Hydrologist (Medical): No    Lack of Transportation (Non-Medical): No  Physical Activity: Insufficiently Active (04/27/2022)   Exercise Vital Sign    Days of Exercise per Week: 3 days    Minutes of Exercise per Session: 30 min  Stress: No Stress Concern Present (04/27/2022)   Martin    Feeling of Stress : Not at all  Social Connections: Moderately Integrated  (04/27/2022)   Social Connection and Isolation Panel [NHANES]    Frequency of Communication with Friends and Family: More than three times a week    Frequency of Social Gatherings with Friends and Family: More than three times a week    Attends Religious Services: More than 4 times per year    Active Member of [employer] or Organizations: No    Attends Music therapist: Never    Marital Status: Married    Tobacco Counseling Counseling given: Not Answered   Clinical Intake:  Pre-visit preparation completed: Yes  Pain : No/denies pain     Nutritional Risks: None Diabetes: No  How often do you need to have someone help you when you read instructions, pamphlets, or other written materials from your doctor or pharmacy?: 1 - Never  Diabetic?no   Interpreter Needed?: No  Information entered by :: Jadene Pierini, LPN   Activities of Daily Living    04/27/2022    9:54 AM  In your present state of health, do you have any difficulty performing the following activities:  Hearing? 0  Vision? 0  Difficulty concentrating or making decisions? 0  Walking or climbing stairs? 0  Dressing or bathing? 0  Doing errands, shopping? 0  Preparing Food and eating ? N  Using the Toilet? N  In the past six months, have you accidently leaked urine? N  Do you have problems with loss of bowel control? N  Managing your Medications? N  Managing your Finances? N  Housekeeping or managing your Housekeeping? N    Patient Care Team: Janora Norlander, DO as PCP - General (Family Medicine)  Indicate any recent Medical Services you may have received from other than [facility] providers in the past year (date may be approximate).     Assessment:   This is a routine wellness examination for Jodi Allen.  Hearing/Vision screen Vision Screening - Comments:: Wears rx glasses - up to date with routine eye exams with  Dr.Lee   Dietary issues and exercise activities discussed: Current Exercise  Habits: Home exercise routine, Type of exercise: walking, Time (Minutes): 30, Frequency (Times/Week): 3, Weekly Exercise (Minutes/Week): 90, Intensity: Mild, Exercise limited by: None identified   Goals Addressed             This Visit's Progress    DIET - INCREASE WATER INTAKE   On track    Try to drink 6-8 glasses of water daily.       Depression Screen    04/27/2022    9:53 AM 03/31/2022   10:17 AM 12/24/2021   11:23 AM 04/21/2021    9:50 AM 12/24/2020    1:40 PM 07/30/2020    8:47 AM 05/10/2020  9:14 AM  PHQ 2/9 Scores  PHQ - 2 Score 0 0 0 1 1 0 0  PHQ- 9 Score 0 0 1  4      Fall Risk    04/27/2022    9:51 AM 03/31/2022   10:17 AM 12/24/2021   11:23 AM 04/21/2021    9:54 AM 12/24/2020    1:39 PM  Fall Risk   Falls in the past year? 0 0 1 0 0  Number falls in past yr: 0 0 0 0 0  Injury with Fall? 0 0 0 0 0  Risk for fall due to : No Fall Risks No Fall Risks History of fall(s) No Fall Risks No Fall Risks  Follow up Falls prevention discussed Education provided Falls evaluation completed Education provided;Falls prevention discussed Falls evaluation completed    FALL RISK PREVENTION PERTAINING TO THE HOME:  Any stairs in or around the home? No  If so, are there any without handrails? No  Home free of loose throw rugs in walkways, pet beds, electrical cords, etc? Yes  Adequate lighting in your home to reduce risk of falls? Yes   ASSISTIVE DEVICES UTILIZED TO PREVENT FALLS:  Life alert? No  Use of a cane, walker or w/c? No  Grab bars in the bathroom? No  Shower chair or bench in shower? No  Elevated toilet seat or a handicapped toilet? No       04/14/2017   11:32 AM  MMSE - Mini Mental State Exam  Orientation to time 5  Orientation to Place 5  Registration 3  Attention/ Calculation 5  Recall 2  Language- name 2 objects 2  Language- repeat 1  Language- follow 3 step command 3  Language- read & follow direction 1  Write a sentence 1  Copy design 1   Total score 29        04/27/2022    9:54 AM 02/16/2020   11:24 AM 09/23/2018    8:37 AM  6CIT Screen  What Year? 0 points 0 points 0 points  What month? 0 points 0 points 0 points  What time? 0 points 0 points 0 points  Count back from 20 0 points 0 points 0 points  Months in reverse 0 points 0 points 0 points  Repeat phrase 0 points 0 points 0 points  Total Score 0 points 0 points 0 points    Immunizations Immunization History  Administered Date(s) Administered   Influenza, High Dose Seasonal PF 10/25/2018   Influenza, Seasonal, Injecte, Preservative Fre 12/03/2012   Influenza,inj,Quad PF,6+ Mos 03/10/2016   Influenza-Unspecified 12/03/2012   Moderna SARS-COV2 Booster Vaccination 03/06/2020   Moderna Sars-Covid-2 Vaccination 06/19/2019, 07/17/2019   PNEUMOCOCCAL CONJUGATE-20 06/24/2021   Pneumococcal Conjugate-13 04/14/2017   Tdap 06/27/2015    TDAP status: Up to date  Flu Vaccine status: Up to date  Pneumococcal vaccine status: Up to date  Covid-19 vaccine status: Completed vaccines  Qualifies for Shingles Vaccine? Yes   Zostavax completed No   Shingrix Completed?: No.    Education has been provided regarding the importance of this vaccine. Patient has been advised to call insurance company to determine out of pocket expense if they have not yet received this vaccine. Advised may also receive vaccine at local pharmacy or Health Dept. Verbalized acceptance and understanding.  Screening Tests Health Maintenance  Topic Date Due   COVID-19 Vaccine (4 - 2023-24 season) 10/03/2021   INFLUENZA VACCINE  05/03/2022 (Originally 09/02/2021)   Zoster Vaccines- Shingrix (1 of  2) 06/29/2022 (Originally 09/21/1992)   Medicare Annual Wellness (AWV)  04/27/2023   DTaP/Tdap/Td (2 - Td or Tdap) 06/26/2025   Pneumonia Vaccine 55+ Years old  Completed   DEXA SCAN  Completed   Hepatitis C Screening  Completed   HPV VACCINES  Aged Out   COLONOSCOPY (Pts 45-55yrs Insurance coverage will  need to be confirmed)  Discontinued    Health Maintenance  Health Maintenance Due  Topic Date Due   COVID-19 Vaccine (4 - 2023-24 season) 10/03/2021    Colorectal cancer screening: No longer required.   Mammogram status: No longer required due to age .  Bone Density status: Completed 05/07/2021. Results reflect: Bone density results: OSTEOPENIA. Repeat every 5 years.  Lung Cancer Screening: (Low Dose CT Chest recommended if Age 31-80 years, 30 pack-year currently smoking OR have quit w/in 15years.) does not qualify.   Lung Cancer Screening Referral: n/a  Additional Screening:  Hepatitis C Screening: does not qualify;   Vision Screening: Recommended annual ophthalmology exams for early detection of glaucoma and other disorders of the eye. Is the patient up to date with their annual eye exam?  Yes  Who is the provider or what is the name of the office in which the patient attends annual eye exams? Dr.Lee  If pt is not established with a provider, would they like to be referred to a provider to establish care? No .   Dental Screening: Recommended annual dental exams for proper oral hygiene  Community Resource Referral / Chronic Care Management: CRR required this visit?  No   CCM required this visit?  No      Plan:     I have personally reviewed and noted the following in the patient's chart:   Medical and social history Use of alcohol, tobacco or illicit drugs  Current medications and supplements including opioid prescriptions. Patient is not currently taking opioid prescriptions. Functional ability and status Nutritional status Physical activity Advanced directives List of other physicians Hospitalizations, surgeries, and ER visits in previous 12 months Vitals Screenings to include cognitive, depression, and falls Referrals and appointments  In addition, I have reviewed and discussed with patient certain preventive protocols, quality metrics, and best practice  recommendations. A written personalized care plan for preventive services as well as general preventive health recommendations were provided to patient.     Daphane Shepherd, LPN   [id number]   Nurse Notes: none

## 2022-04-27 NOTE — Patient Instructions (Signed)
[name] , Thank you for taking time to come for your Medicare Wellness Visit. I appreciate your ongoing commitment to your health goals. Please review the following plan we discussed and let me know if I can assist you in the future.   These are the goals we discussed:  Goals      AWV     02/16/2020 AWV Goal: Fall Prevention  Over the next year, patient will decrease their risk for falls by: Using assistive devices, such as a cane or walker, as needed Identifying fall risks within their home and correcting them by: Removing throw rugs Adding handrails to stairs or ramps Removing clutter and keeping a clear pathway throughout the home Increasing light, especially at night Adding shower handles/bars Raising toilet seat Identifying potential personal risk factors for falls: Medication side effects Incontinence/urgency Vestibular dysfunction Hearing loss Musculoskeletal disorders Neurological disorders Orthostatic hypotension       CCM Expected Outcome:  Monitor, Self-Manage and Reduce Symptoms of:     DIET - INCREASE WATER INTAKE     Try to drink 6-8 glasses of water daily.     Exercise 150 min/wk Moderate Activity        This is a list of the screening recommended for you and due dates:  Health Maintenance  Topic Date Due   COVID-19 Vaccine (4 - 2023-24 season) 10/03/2021   Flu Shot  05/03/2022*   Zoster (Shingles) Vaccine (1 of 2) 06/29/2022*   Medicare Annual Wellness Visit  04/27/2023   DTaP/Tdap/Td vaccine (2 - Td or Tdap) 06/26/2025   Pneumonia Vaccine  Completed   DEXA scan (bone density measurement)  Completed   Hepatitis C Screening: USPSTF Recommendation to screen - Ages 67-79 yo.  Completed   HPV Vaccine  Aged Out   Colon Cancer Screening  Discontinued  *Topic was postponed. The date shown is not the original due date.    Advanced directives: Advance directive discussed with you today. I have provided a copy for you to complete at home and have  notarized. Once this is complete please bring a copy in to our office so we can scan it into your chart.   Conditions/risks identified: Aim for 30 minutes of exercise or brisk walking, 6-8 glasses of water, and 5 servings of fruits and vegetables each day.   Next appointment: Follow up in one year for your annual wellness visit    Preventive Care 65 Years and Older, Female Preventive care refers to lifestyle choices and visits with your health care provider that can promote health and wellness. What does preventive care include? A yearly physical exam. This is also called an annual well check. Dental exams once or twice a year. Routine eye exams. Ask your health care provider how often you should have your eyes checked. Personal lifestyle choices, including: Daily care of your teeth and gums. Regular physical activity. Eating a healthy diet. Avoiding tobacco and drug use. Limiting alcohol use. Practicing safe sex. Taking low-dose aspirin every day. Taking vitamin and mineral supplements as recommended by your health care provider. What happens during an annual well check? The services and screenings done by your health care provider during your annual well check will depend on your age, overall health, lifestyle risk factors, and family history of disease. Counseling  Your health care provider may ask you questions about your: Alcohol use. Tobacco use. Drug use. Emotional well-being. Home and relationship well-being. Sexual activity. Eating habits. History of falls. Memory and ability to understand (cognition).  Work and work Statistician. Reproductive health. Screening  You may have the following tests or measurements: Height, weight, and BMI. Blood pressure. Lipid and cholesterol levels. These may be checked every 5 years, or more frequently if you are over 78 years old. Skin check. Lung cancer screening. You may have this screening every year starting at age 79 if you have  a 30-pack-year history of smoking and currently smoke or have quit within the past 15 years. Fecal occult blood test (FOBT) of the stool. You may have this test every year starting at age 32. Flexible sigmoidoscopy or colonoscopy. You may have a sigmoidoscopy every 5 years or a colonoscopy every 10 years starting at age 59. Hepatitis C blood test. Hepatitis B blood test. Sexually transmitted disease (STD) testing. Diabetes screening. This is done by checking your blood sugar (glucose) after you have not eaten for a while (fasting). You may have this done every 1-3 years. Bone density scan. This is done to screen for osteoporosis. You may have this done starting at age 45. Mammogram. This may be done every 1-2 years. Talk to your health care provider about how often you should have regular mammograms. Talk with your health care provider about your test results, treatment options, and if necessary, the need for more tests. Vaccines  Your health care provider may recommend certain vaccines, such as: Influenza vaccine. This is recommended every year. Tetanus, diphtheria, and acellular pertussis (Tdap, Td) vaccine. You may need a Td booster every 10 years. Zoster vaccine. You may need this after age 48. Pneumococcal 13-valent conjugate (PCV13) vaccine. One dose is recommended after age 30. Pneumococcal polysaccharide (PPSV23) vaccine. One dose is recommended after age 86. Talk to your health care provider about which screenings and vaccines you need and how often you need them. This information is not intended to replace advice given to you by your health care provider. Make sure you discuss any questions you have with your health care provider. Document Released: 02/15/2015 Document Revised: 10/09/2015 Document Reviewed: 11/20/2014 Elsevier Interactive Patient Education  2017 [place] Prevention in the Home Falls can cause injuries. They can happen to people of all ages. There are many  things you can do to make your home safe and to help prevent falls. What can I do on the outside of my home? Regularly fix the edges of walkways and driveways and fix any cracks. Remove anything that might make you trip as you walk through a door, such as a raised step or threshold. Trim any bushes or trees on the path to your home. Use bright outdoor lighting. Clear any walking paths of anything that might make someone trip, such as rocks or tools. Regularly check to see if handrails are loose or broken. Make sure that both sides of any steps have handrails. Any raised decks and porches should have guardrails on the edges. Have any leaves, snow, or ice cleared regularly. Use sand or salt on walking paths during winter. Clean up any spills in your garage right away. This includes oil or grease spills. What can I do in the bathroom? Use night lights. Install grab bars by the toilet and in the tub and shower. Do not use towel bars as grab bars. Use non-skid mats or decals in the tub or shower. If you need to sit down in the shower, use a plastic, non-slip stool. Keep the floor dry. Clean up any water that spills on the floor as soon as it happens. Remove  soap buildup in the tub or shower regularly. Attach bath mats securely with double-sided non-slip rug tape. Do not have throw rugs and other things on the floor that can make you trip. What can I do in the bedroom? Use night lights. Make sure that you have a light by your bed that is easy to reach. Do not use any sheets or blankets that are too big for your bed. They should not hang down onto the floor. Have a firm chair that has side arms. You can use this for support while you get dressed. Do not have throw rugs and other things on the floor that can make you trip. What can I do in the kitchen? Clean up any spills right away. Avoid walking on wet floors. Keep items that you use a lot in easy-to-reach places. If you need to reach  something above you, use a strong step stool that has a grab bar. Keep electrical cords out of the way. Do not use floor polish or wax that makes floors slippery. If you must use wax, use non-skid floor wax. Do not have throw rugs and other things on the floor that can make you trip. What can I do with my stairs? Do not leave any items on the stairs. Make sure that there are handrails on both sides of the stairs and use them. Fix handrails that are broken or loose. Make sure that handrails are as long as the stairways. Check any carpeting to make sure that it is firmly attached to the stairs. Fix any carpet that is loose or worn. Avoid having throw rugs at the top or bottom of the stairs. If you do have throw rugs, attach them to the floor with carpet tape. Make sure that you have a light switch at the top of the stairs and the bottom of the stairs. If you do not have them, ask someone to add them for you. What else can I do to help prevent falls? Wear shoes that: Do not have high heels. Have rubber bottoms. Are comfortable and fit you well. Are closed at the toe. Do not wear sandals. If you use a stepladder: Make sure that it is fully opened. Do not climb a closed stepladder. Make sure that both sides of the stepladder are locked into place. Ask someone to hold it for you, if possible. Clearly mark and make sure that you can see: Any grab bars or handrails. First and last steps. Where the edge of each step is. Use tools that help you move around (mobility aids) if they are needed. These include: Canes. Walkers. Scooters. Crutches. Turn on the lights when you go into a dark area. Replace any light bulbs as soon as they burn out. Set up your furniture so you have a clear path. Avoid moving your furniture around. If any of your floors are uneven, fix them. If there are any pets around you, be aware of where they are. Review your medicines with your doctor. Some medicines can make you  feel dizzy. This can increase your chance of falling. Ask your doctor what other things that you can do to help prevent falls. This information is not intended to replace advice given to you by your health care provider. Make sure you discuss any questions you have with your health care provider. Document Released: 11/15/2008 Document Revised: 06/27/2015 Document Reviewed: 02/23/2014 Elsevier Interactive Patient Education  2017 [employer].

## 2022-04-29 ENCOUNTER — Other Ambulatory Visit: Payer: Self-pay | Admitting: Family Medicine

## 2022-04-29 ENCOUNTER — Other Ambulatory Visit: Payer: Medicare Other

## 2022-04-29 DIAGNOSIS — Z1231 Encounter for screening mammogram for malignant neoplasm of breast: Secondary | ICD-10-CM

## 2022-04-29 DIAGNOSIS — N1831 Chronic kidney disease, stage 3a: Secondary | ICD-10-CM

## 2022-04-30 LAB — BASIC METABOLIC PANEL
BUN/Creatinine Ratio: 14 (ref 12–28)
BUN: 13 mg/dL (ref 8–27)
CO2: 22 mmol/L (ref 20–29)
Calcium: 9 mg/dL (ref 8.7–10.3)
Chloride: 101 mmol/L (ref 96–106)
Creatinine, Ser: 0.96 mg/dL (ref 0.57–1.00)
Glucose: 93 mg/dL (ref 70–99)
Potassium: 4.3 mmol/L (ref 3.5–5.2)
Sodium: 141 mmol/L (ref 134–144)
eGFR: 60 mL/min/{1.73_m2} (ref >59)

## 2022-05-04 ENCOUNTER — Ambulatory Visit (INDEPENDENT_AMBULATORY_CARE_PROVIDER_SITE_OTHER): Payer: Medicare Other

## 2022-05-04 DIAGNOSIS — N1831 Chronic kidney disease, stage 3a: Secondary | ICD-10-CM

## 2022-05-04 DIAGNOSIS — E7849 Other hyperlipidemia: Secondary | ICD-10-CM

## 2022-05-04 NOTE — Plan of Care (Signed)
Chronic Care Management Provider Comprehensive Care Plan    05/04/2022 Name: Jodi Allen MRN: [id number] DOB: 1940-02-26  Referral to Chronic Care Management (CCM) services was placed by Provider:  Janora Norlander, DO on Date: 04/15/22.   Chronic Condition 1: Chronic Kidney Disease Provider Assessment and Plan: Chronic kidney disease, stage 3a ([place]) - Plan: dapagliflozin propanediol (FARXIGA) 5 MG TABS tablet, AMB Referral to Chronic Care Management Services   Expected Outcome/Goals Addressed This Visit (Provider CCM goals/Provider Assessment and plan  Goal: CCM (Chronic Kidney Disease) Expected Outcome:  Monitor, Self-Manage And Reduce Symptoms of Chronic Kidney Disease  Symptom Management Condition 1: Take medications as prescribed   Attend all scheduled provider appointments Complete labs as ordered Complete outreach with CCM Pharmacist as scheduled on 05/19/22 Call provider office for new concerns or questions     Chronic Condition 2: Hyperlipidemia Provider Assessment and Plan: Mixed hyperlipidemia - Plan: CMP14+EGFR, Lipid Panel, TSH, rosuvastatin (CRESTOR) 10 MG tablet   Expected Outcome/Goals Addressed This Visit (Provider CCM goals/Provider Assessment and plan  Goal: CCM (Hyperlipidemia) Expected Outcome:  Monitor, Self-Manage And Reduce Symptoms of Hyperlipidemia  Symptom Management Condition 2: Take medications as prescribed   Attend all scheduled provider appointments Complete labs as ordered Call pharmacy for medication refills 3-7 days in advance of running out of medications Call provider or CCM Pharmacist if unable to tolerate statin Call provider office for new concerns or questions    Problem List Patient Active Problem List   Diagnosis Date Noted   Chronic kidney disease, stage 3a 04/15/2022   Morbid obesity 07/30/2020   Chronic pain of left knee 01/31/2020   Essential hypertension 04/25/2018   Hyperglycemia 02/21/2015   Hyperlipidemia  02/21/2015   GERD (gastroesophageal reflux disease) 03/20/2014   Osteopenia     Medication Management  Current Outpatient Medications:    Cholecalciferol 1.25 MG (50000 UT) capsule, Take 1 capsule (50,000 Units total) by mouth every 7 (seven) days., Disp: 12 capsule, Rfl: 3   dapagliflozin propanediol (FARXIGA) 5 MG TABS tablet, Take 1 tablet (5 mg total) by mouth daily before breakfast., Disp: 30 tablet, Rfl: 12   diclofenac Sodium (VOLTAREN) 1 % GEL, Apply 4 g topically 4 (four) times daily. (knee pain).  If not covered please show her where the OTC version is, Disp: 400 g, Rfl: 2   rosuvastatin (CRESTOR) 10 MG tablet, Take 1 tablet (10 mg total) by mouth daily., Disp: 90 tablet, Rfl: 3   tiZANidine (ZANAFLEX) 4 MG tablet, Take 1 tablet (4 mg total) by mouth every 8 (eight) hours as needed for muscle spasms., Disp: 90 tablet, Rfl: 1   busPIRone (BUSPAR) 5 MG tablet, Take 1 tablet (5 mg total) by mouth 2 (two) times daily. For stress/anxiety (Patient not taking: Reported on 05/04/2022), Disp: 180 tablet, Rfl: 3  Cognitive Assessment Identity Confirmed: : Name; DOB Cognitive Status: Normal   Functional Assessment Hearing Difficulty or Deaf: no Wear Glasses or Blind: yes Vision Management: Wears prescription glasses Concentrating, Remembering or Making Decisions Difficulty (CP): no Difficulty Communicating: no Difficulty Eating/Swallowing: no Walking or Climbing Stairs Difficulty: yes Walking or Climbing Stairs: stair climbing difficulty, requires equipment Mobility Management: Reports using cane-like device/stick when ambulating uphill Dressing/Bathing Difficulty: no Doing Errands Independently Difficulty (such as shopping) (CP): no   Caregiver Assessment  Primary Source of Support/Comfort: child(ren) Name of Support/Comfort Primary Source: [place] in Home: spouse Family Caregiver if Needed: none Primary Roles/Responsibilities: caregiver for other(s)   Planned  Interventions  Hyperlipidemia Reviewed plan for management of hyperlipidemia. Reviewed medications. Reports missing a few days of rosuvastatin/Crestor but tolerating medication well. Denies concerns r/t medication management. Advised to take daily as prescribed and notify provider or CCM Pharmacist if unable to tolerate regimen. Discussed importance of completing labs as prescribed. Provided HLD educational materials via Mychart. Reviewed role and benefits of statin for ASCVD risk reduction. Agreed to notify PCP if unable to manage statin-induced myalgias. Reviewed importance of limiting foods high in cholesterol. Advised to avoid highly processed foods when possible. Reports doing very well with nutritional intake. Reports limited consumption of fried and highly processed foods. Reports lean chicken is protein of choice. Reviewed exercise goals and target of 150 minutes per week. Reports change in exercise routine d/t serving as the primary caregiver for her spouse. Currently not going to the gym but engages in low impact walks in her large yard. Reviewed safety and fall prevention measures.  Screening for signs and symptoms of depression related to chronic disease state. Assessed social determinant of health barriers.  Chronic Kidney Disease Reviewed treatment plan for management of Chronic Kidney Disease. Reviewed medications and discussed importance of compliance. Reports taking [name] as prescribed. Expressed concerns r/t prescription cost. She is aware of pending outreach with the CCM Pharmacist.  Provided education r/t stroke prevention. Reviewed s/sx of heart attack and stroke.  Provided information regarding increased risk for kidney impairment if blood pressure is poorly controlled.  Discussed importance of completing prescribed labs to monitor kidney function. Discussed importance of adhering to recommended cardiac prudent/renal diet. Reports doing well with nutritional intake and  attempting to maintain adequate hydration. Discussed importance of seeking advice from PCP prior to taking non prescribed medications. Advised to keep appointment with CCM Pharmacist as scheduled on 05/19/22.     Interaction and coordination with outside resources, practitioners, and providers See CCM Referral  Care Plan: Available in MyChart

## 2022-05-04 NOTE — Chronic Care Management (AMB) (Signed)
Chronic Care Management   CCM RN Visit Note  05/04/2022 Name: Jodi Allen MRN: [id number] DOB: [birth year]  Subjective: Jodi Allen is a 82 y.o. year old female who is a primary care patient of Janora Norlander, DO. The patient was referred to the Chronic Care Management team for assistance with care management needs subsequent to provider initiation of CCM services and plan of care.    Today's Visit:  Engaged with patient by telephone for initial visit.     SDOH Interventions Today    Flowsheet Row Most Recent Value  SDOH Interventions   Food Insecurity Interventions Intervention Not Indicated  Housing Interventions Intervention Not Indicated  Transportation Interventions Intervention Not Indicated  Utilities Interventions Intervention Not Indicated  Alcohol Usage Interventions Intervention Not Indicated (Score <7)  Financial Strain Interventions Intervention Not Indicated  Physical Activity Interventions Intervention Not Indicated  Stress Interventions Intervention Not Indicated  Social Connections Interventions Intervention Not Indicated          Goals Addressed             This Visit's Progress    Goal: CCM (Chronic Kidney Disease) Expected Outcome:  Monitor, Self-Manage And Reduce Symptoms of Chronic Kidney Disease       Current Barriers:  Chronic Disease Management support and education needs related to Chronic Kidney Disease  Planned Interventions: Reviewed treatment plan for management of Chronic Kidney Disease. Reviewed medications and discussed importance of compliance. Reports taking [name] as prescribed. Expressed concerns r/t prescription cost. She is aware of pending outreach with the CCM Pharmacist.  Provided education r/t stroke prevention. Reviewed s/sx of heart attack and stroke.  Provided information regarding increased risk for kidney impairment if blood pressure is poorly controlled.  Discussed importance of completing prescribed labs  to monitor kidney function. Discussed importance of adhering to recommended cardiac prudent/renal diet. Reports doing well with nutritional intake and attempting to maintain adequate hydration. Discussed importance of seeking advice from PCP prior to taking non prescribed medications. Advised to keep appointment with CCM Pharmacist as scheduled on 05/19/22.     Lab Results  Component Value Date   CREATININE 0.96 04/29/2022   CREATININE 0.98 03/31/2022   CREATININE 0.98 06/24/2021    Lab Results  Component Value Date   BUN 13 04/29/2022    BP Readings from Last 3 Encounters:  04/15/22 131/66  03/31/22 138/76  12/24/21 130/70     Symptom Management: Take medications as prescribed   Attend all scheduled provider appointments Complete labs as ordered Complete outreach with CCM Pharmacist as scheduled on 05/19/22 Call provider office for new concerns or questions    Follow Up Plan:  Will follow up next month     Goal: CCM (Hyperlipidemia) Expected Outcome:  Monitor, Self-Manage And Reduce Symptoms of Hyperlipidemia       Current Barriers:  Chronic Disease Management support and education needs related to Hyperlipidemia  Planned Interventions: Reviewed plan for management of hyperlipidemia. Reviewed medications. Reports missing a few days of rosuvastatin/Crestor but tolerating medication well. Denies concerns r/t medication management. Advised to take daily as prescribed and notify provider or CCM Pharmacist if unable to tolerate regimen. Discussed importance of completing labs as prescribed. Provided HLD educational materials via Mychart. Reviewed role and benefits of statin for ASCVD risk reduction. Agreed to notify PCP if unable to manage statin-induced myalgias. Reviewed importance of limiting foods high in cholesterol. Advised to avoid highly processed foods when possible. Reports doing very well with nutritional intake. Reports limited consumption  of fried and highly  processed foods. Reports lean chicken is protein of choice. Reviewed exercise goals and target of 150 minutes per week. Reports change in exercise routine d/t serving as the primary caregiver for her spouse. Currently not going to the gym but engages in low impact walks in her large yard. Reviewed safety and fall prevention measures.  Screening for signs and symptoms of depression related to chronic disease state. Assessed social determinant of health barriers.   Lab Results  Component Value Date   CHOL 174 06/24/2021   HDL 43 06/24/2021   LDLCALC 109 (H) 06/24/2021   TRIG 123 06/24/2021   CHOLHDL 4.0 06/24/2021     Symptom Management: Take medications as prescribed   Attend all scheduled provider appointments Complete labs as ordered Call pharmacy for medication refills 3-7 days in advance of running out of medications Call provider or CCM Pharmacist if unable to tolerate statin Call provider office for new concerns or questions   Follow Up Plan:  Will follow up next month              PLAN: A member of the care management team will follow up next month.   Horris Latino RN Care Manager/Chronic Care Management [phone]

## 2022-05-04 NOTE — Patient Instructions (Signed)
Thank you for allowing the Chronic Care Management team to participate in your care. It was great speaking with you!   Our next outreach is scheduled for Jun 15, 2022 at [place]. Please do not hesitate to call if you require assistance prior to our next outreach.   Following is a copy of the CCM Program Consent:  CCM service includes personalized support from designated clinical staff supervised by the physician, including individualized plan of care and coordination with other care providers 24/7 contact phone numbers for assistance for urgent and routine care needs. Service will only be billed when office clinical staff spend 20 minutes or more in a month to coordinate care. Only one practitioner may furnish and bill the service in a calendar month. The patient may stop CCM services at amy time (effective at the end of the month) by phone call to the office staff. The patient will be responsible for cost sharing (co-pay) or up to 20% of the service fee (after annual deductible is met)  Following is a copy of your full provider care plan:   Goals Addressed             This Visit's Progress    Goal: CCM (Chronic Kidney Disease) Expected Outcome:  Monitor, Self-Manage And Reduce Symptoms of Chronic Kidney Disease       Current Barriers:  Chronic Disease Management support and education needs related to Chronic Kidney Disease  Planned Interventions: Reviewed treatment plan for management of Chronic Kidney Disease. Reviewed medications and discussed importance of compliance. Reports taking Jodi Allen as prescribed. Expressed concerns r/t prescription cost. She is aware of pending outreach with the CCM Pharmacist.  Provided education r/t stroke prevention. Reviewed s/sx of heart attack and stroke.  Provided information regarding increased risk for kidney impairment if blood pressure is poorly controlled.  Discussed importance of completing prescribed labs to monitor kidney function. Discussed  importance of adhering to recommended cardiac prudent/renal diet. Reports doing well with nutritional intake and attempting to maintain adequate hydration. Discussed importance of seeking advice from PCP prior to taking non prescribed medications. Advised to keep appointment with CCM Pharmacist as scheduled on 05/19/22.     Lab Results  Component Value Date   CREATININE 0.96 04/29/2022   CREATININE 0.98 03/31/2022   CREATININE 0.98 06/24/2021    Lab Results  Component Value Date   BUN 13 04/29/2022    BP Readings from Last 3 Encounters:  04/15/22 131/66  03/31/22 138/76  12/24/21 130/70     Symptom Management: Take medications as prescribed   Attend all scheduled provider appointments Complete labs as ordered Complete outreach with CCM Pharmacist as scheduled on 05/19/22 Call provider office for new concerns or questions    Follow Up Plan:  Will follow up next month     Goal: CCM (Hyperlipidemia) Expected Outcome:  Monitor, Self-Manage And Reduce Symptoms of Hyperlipidemia       Current Barriers:  Chronic Disease Management support and education needs related to Hyperlipidemia  Planned Interventions: Reviewed plan for management of hyperlipidemia. Reviewed medications. Reports missing a few days of rosuvastatin/Crestor but tolerating medication well. Denies concerns r/t medication management. Advised to take daily as prescribed and notify provider or CCM Pharmacist if unable to tolerate regimen. Discussed importance of completing labs as prescribed. Provided HLD educational materials via Mychart. Reviewed role and benefits of statin for ASCVD risk reduction. Agreed to notify PCP if unable to manage statin-induced myalgias. Reviewed importance of limiting foods high in cholesterol. Advised to avoid  highly processed foods when possible. Reports doing very well with nutritional intake. Reports limited consumption of fried and highly processed foods. Reports lean chicken is  protein of choice. Reviewed exercise goals and target of 150 minutes per week. Reports change in exercise routine d/t serving as the primary caregiver for her spouse. Currently not going to the gym but engages in low impact walks in her large yard. Reviewed safety and fall prevention measures.  Screening for signs and symptoms of depression related to chronic disease state. Assessed social determinant of health barriers.   Lab Results  Component Value Date   CHOL 174 06/24/2021   HDL 43 06/24/2021   LDLCALC 109 (H) 06/24/2021   TRIG 123 06/24/2021   CHOLHDL 4.0 06/24/2021     Symptom Management: Take medications as prescribed   Attend all scheduled provider appointments Complete labs as ordered Call pharmacy for medication refills 3-7 days in advance of running out of medications Call provider or CCM Pharmacist if unable to tolerate statin Call provider office for new concerns or questions   Follow Up Plan:  Will follow up next month             Jodi Allen verbalizes understanding of instructions and care plan provided today. Agreed to view in [name].   A member of the care management team will follow up next month.     Jodi Latino RN Care Manager/Chronic Care Management [phone]

## 2022-05-11 ENCOUNTER — Ambulatory Visit
Admission: RE | Admit: 2022-05-11 | Discharge: 2022-05-11 | Disposition: A | Discharge status: Home / Self Care | Payer: Medicare Other | Source: Ambulatory Visit | Attending: Family Medicine | Admitting: Family Medicine

## 2022-05-11 DIAGNOSIS — Z1231 Encounter for screening mammogram for malignant neoplasm of breast: Secondary | ICD-10-CM

## 2022-05-19 ENCOUNTER — Telehealth: Payer: Self-pay | Admitting: Pharmacist

## 2022-05-19 ENCOUNTER — Ambulatory Visit: Payer: Medicare Other | Admitting: Pharmacist

## 2022-05-19 DIAGNOSIS — N1831 Chronic kidney disease, stage 3a: Secondary | ICD-10-CM

## 2022-05-19 MED ORDER — DAPAGLIFLOZIN PROPANEDIOL 5 MG PO TABS
10.0000 mg | ORAL_TABLET | Freq: Every day | ORAL | 5 refills | Status: DC
Start: 2022-05-19 — End: 2022-11-27

## 2022-05-19 NOTE — Telephone Encounter (Signed)
Please email me PAP for Farxiga  RX escribed to [facility] Thanks!

## 2022-05-19 NOTE — Progress Notes (Signed)
05/19/2022 Name: Jodi Allen MRN: [id number] DOB: 03-29-1940  Chief Complaint  Patient presents with   Medication Management    Farxiga PAP    Jodi Allen is a 82 y.o. year old female who presented for a telephone visit.   They were referred to the pharmacist by their PCP for assistance in managing medication access Jodi Allen for CKD).   Subjective:  Care Team: Primary Care Provider: Raliegh Ip, DO ; Next Scheduled Visit: not yet scheduled  Medication Access/Adherence  Current Pharmacy:  [facility] [place], [place] - [address] [place] [address] Phone: [phone] Fax: [phone]  [facility] - [place], [place] - [address] [place] [address] Phone: [phone] Fax: [phone]  [facility] [address] [place] - [place] [address] [place] [address] Phone: [phone] Fax: [phone]   Patient reports affordability concerns with their medications: Yes - Farxiga copay was $100 Patient reports access/transportation concerns to their pharmacy: No  Patient reports adherence concerns with their medications:  No     CKD: Current medications: Farxiga 5 mg daily Medications previously tried: none, recently diagnosed  Patient denies hypotensive s/sx including dizziness, lightheadedness.   Objective:  Lab Results  Component Value Date   HGBA1C 5.3 06/24/2021    Lab Results  Component Value Date   CREATININE 0.96 04/29/2022   BUN 13 04/29/2022   NA 141 04/29/2022   K 4.3 04/29/2022   CL 101 04/29/2022   CO2 22 04/29/2022    Lab Results  Component Value Date   CHOL 174 06/24/2021   HDL 43 06/24/2021   LDLCALC 109 (H) 06/24/2021   TRIG 123 06/24/2021   CHOLHDL 4.0 06/24/2021    Medications Reviewed Today     Reviewed by Jodi Allen, [facility] (Pharmacist) on 05/19/22 at [phone]  Med List Status: <None>    Medication Order Taking? Sig Documenting Provider Last Dose Status Informant  busPIRone (BUSPAR) 5 MG tablet [id number]  Take 1 tablet (5 mg total) by mouth 2 (two) times daily. For stress/anxiety  Patient not taking: Reported on 05/04/2022   Jodi Ip, DO  Active            Med Note Jodi Allen, Jodi Allen   Mon May 04, 2022  9:59 AM) Patient reports not taking  Cholecalciferol 1.25 MG (50000 UT) capsule [id number]  Take 1 capsule (50,000 Units total) by mouth every 7 (seven) days. Jodi Flavin M, DO  Active   dapagliflozin propanediol (FARXIGA) 5 MG TABS tablet [id number] Yes Take 1 tablet (5 mg total) by mouth daily before breakfast. Jodi Flavin M, DO Taking Active   diclofenac Sodium (VOLTAREN) 1 % GEL [id number]  Apply 4 g topically 4 (four) times daily. (knee pain).  If not covered please show her where the OTC version is Jodi Flavin M, DO  Active   rosuvastatin (CRESTOR) 10 MG tablet [id number]  Take 1 tablet (10 mg total) by mouth daily. Jodi Flavin M, DO  Active   tiZANidine (ZANAFLEX) 4 MG tablet [id number]  Take 1 tablet (4 mg total) by mouth every 8 (eight) hours as needed for muscle spasms. Jodi Flavin M, DO  Active               Assessment/Plan:   CKD: - Recently diagnosed and started on Farxiga 5 mg daily. Patient picked up first month and has  tolerated it without issue. Copay was $100 and patient is requesting patient assistance for [place].  - Patient agreeable for [place] to start her application on her behalf. Once application is complete, it will take 4-6 weeks for PAP status to be determined.    Follow Up Plan:  Pharmacist: 4-6 weeks  Jodi Allen, Pharm.D. PGY-2 Ambulatory Care Pharmacy Resident  Jodi Allen, PharmD, BCACP Clinical Pharmacist, [facility]

## 2022-05-26 NOTE — Telephone Encounter (Signed)
Submitted application for FARXIGA to [place].   Phone: [phone]  

## 2022-06-01 NOTE — Telephone Encounter (Signed)
Received notification from [place] regarding approval for [facility]. Patient assistance approved from 05/27/22 to 02/02/23.  Phone: [phone]

## 2022-06-02 DIAGNOSIS — E785 Hyperlipidemia, unspecified: Secondary | ICD-10-CM | POA: Diagnosis not present

## 2022-06-02 DIAGNOSIS — N1831 Chronic kidney disease, stage 3a: Secondary | ICD-10-CM

## 2022-06-15 ENCOUNTER — Telehealth: Payer: Medicare Other

## 2022-06-18 ENCOUNTER — Encounter: Payer: Self-pay | Admitting: Pharmacist

## 2022-06-18 ENCOUNTER — Ambulatory Visit (INDEPENDENT_AMBULATORY_CARE_PROVIDER_SITE_OTHER): Payer: Medicare Other | Admitting: Pharmacist

## 2022-06-18 DIAGNOSIS — N1831 Chronic kidney disease, stage 3a: Secondary | ICD-10-CM

## 2022-06-18 NOTE — Progress Notes (Signed)
06/18/2022 Name: Jodi Allen    MRN: [id number]       DOB: 08-30-1940       Chief Complaint  Patient presents with   Medication Management      Farxiga PAP      Jodi Allen is a 82 y.o. year old female who presented for a telephone visit.   They were referred to the pharmacist by their PCP for assistance in managing medication access Marcelline Deist for CKD).  Patient is now enrolled in the AZ&me patient assistance program for [place].   Subjective:   Care Team: Primary Care Provider: Raliegh Ip, DO ; Next Scheduled Visit: not yet scheduled   Medication Access/Adherence   Current Pharmacy:  [facility] [place], [place] - [address] [place] [address] Phone: [phone] Fax: [phone]   [facility] - [place], [place] - [address] [place] [address] Phone: [phone] Fax: [phone]   [facility] [address] [place] - [place] [address] [place] [address] Phone: [phone] Fax: [phone]     Patient reports affordability concerns with their medications: Yes - Farxiga copay was $100 Patient reports access/transportation concerns to their pharmacy: No  Patient reports adherence concerns with their medications:  No       CKD: Current medications: Farxiga 5 mg daily Medications previously tried: none, recently diagnosed   Patient denies hypotensive s/sx including dizziness, lightheadedness.    Objective:   Recent Labs       Lab Results  Component Value Date    HGBA1C 5.3 06/24/2021        Recent Labs       Lab Results  Component Value Date    CREATININE 0.96 04/29/2022    BUN 13 04/29/2022    NA 141 04/29/2022    K 4.3 04/29/2022    CL 101 04/29/2022    CO2 22 04/29/2022        Recent Labs       Lab Results  Component Value Date    CHOL 174 06/24/2021    HDL 43 06/24/2021    LDLCALC 109 (H)  06/24/2021    TRIG 123 06/24/2021    CHOLHDL 4.0 06/24/2021        Medications Reviewed Today       Reviewed by Rushie Goltz, [facility] (Pharmacist) on 05/19/22 at [id number]  Med List Status: <None>    Medication Order Taking? Sig Documenting Provider Last Dose Status Informant  busPIRone (BUSPAR) 5 MG tablet [id number]   Take 1 tablet (5 mg total) by mouth 2 (two) times daily. For stress/anxiety  Patient not taking: Reported on 05/04/2022   Raliegh Ip, DO   Active                     Med Note [facility], FELECIA N   Mon May 04, 2022  9:59 AM) Patient reports not taking  Cholecalciferol 1.25 MG (50000 UT) capsule [id number]   Take 1 capsule (50,000 Units total) by mouth every 7 (seven) days. Delynn Flavin M, DO   Active    dapagliflozin propanediol (FARXIGA) 5 MG TABS tablet [id number] Yes Take 1 tablet (5 mg total) by mouth daily before breakfast. Delynn Flavin M, DO Taking Active    diclofenac Sodium (VOLTAREN) 1 % GEL [id number]   Apply 4 g topically 4 (four) times  daily. (knee pain).  If not covered please show her where the OTC version is Delynn Flavin M, DO   Active    rosuvastatin (CRESTOR) 10 MG tablet [id number]   Take 1 tablet (10 mg total) by mouth daily. Delynn Flavin M, DO   Active    tiZANidine (ZANAFLEX) 4 MG tablet [id number]   Take 1 tablet (4 mg total) by mouth every 8 (eight) hours as needed for muscle spasms. Delynn Flavin M, DO   Active                        Assessment/Plan:    CKD: - Recently diagnosed and started on Farxiga 5 mg daily. Patient now enrolled in AZ&me PAP for [place].  She received 3 months worth in the mail.  Escribe to [facility] in [facility] for refills.  Denies side effects.  She must re-enroll every year.    Kieth Brightly, PharmD, BCACP Clinical Pharmacist, [facility]

## 2022-09-19 IMAGING — MG MM DIGITAL SCREENING BILAT W/ TOMO AND CAD
6 of 10 series · 6 of 30 positions shown · non-contrast
Comparison: Previous exam(s).

CLINICAL DATA: Screening.

EXAM:
DIGITAL SCREENING BILATERAL MAMMOGRAM WITH TOMOSYNTHESIS AND CAD
TECHNIQUE: Bilateral screening digital craniocaudal and mediolateral oblique
mammograms were obtained. Bilateral screening digital breast
tomosynthesis was performed. The images were evaluated with
computer-aided detection.

[L MLO synth-2D (1 of 2)]
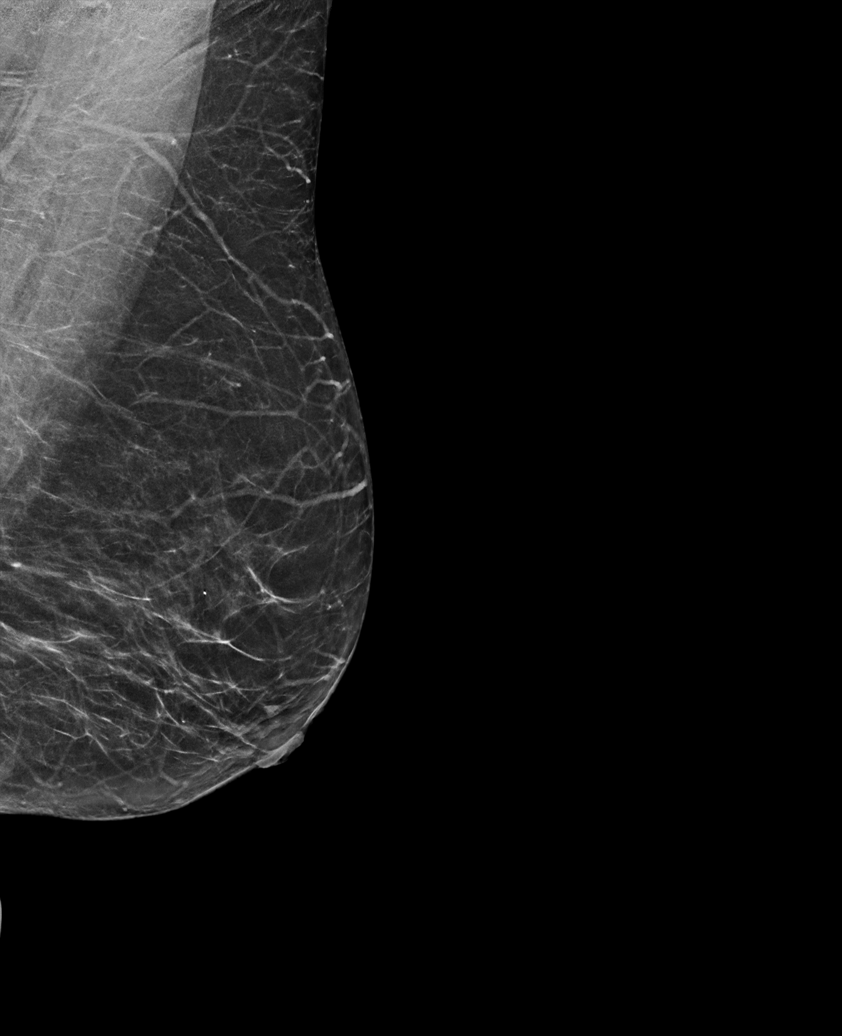

[R MLO synth-2D]
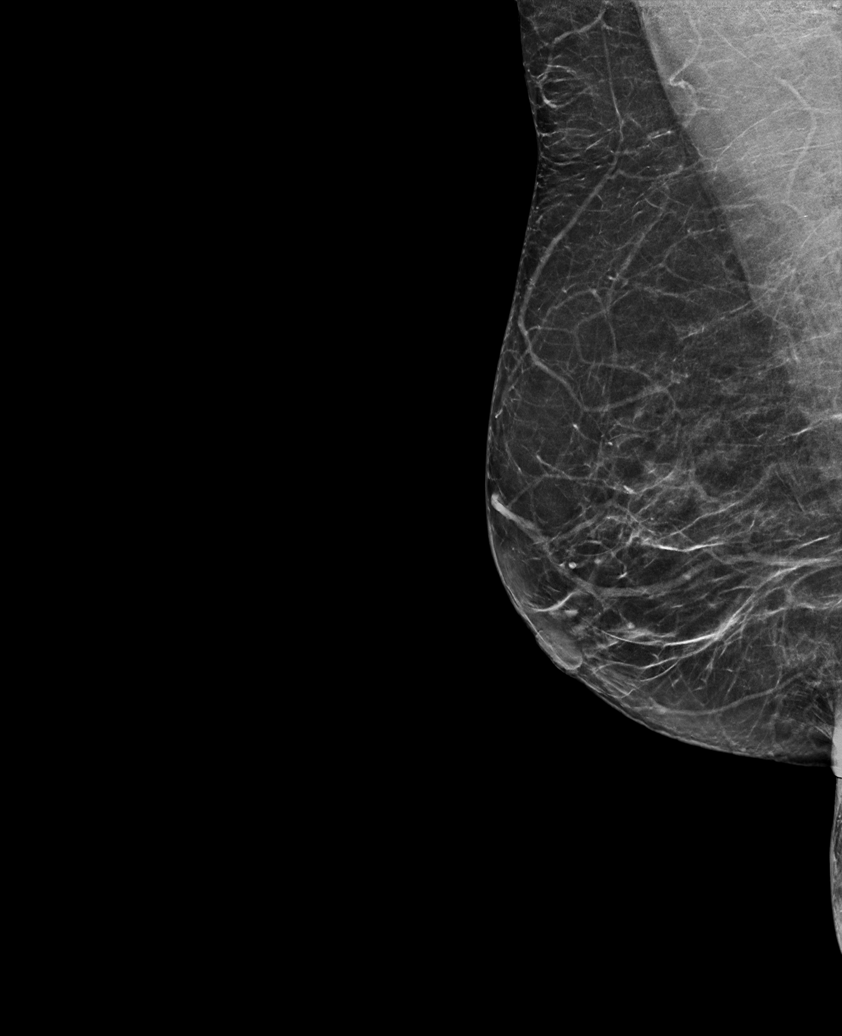

[L CC synth-2D]
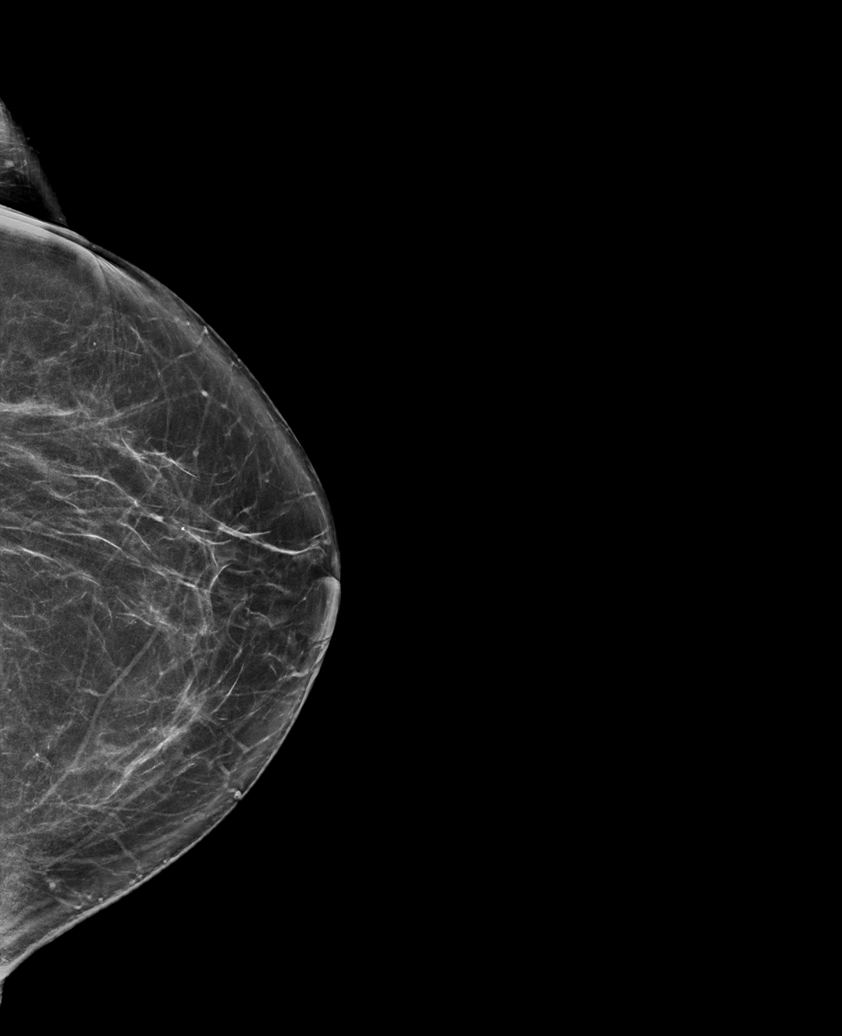

[L MLO synth-2D (2 of 2)]
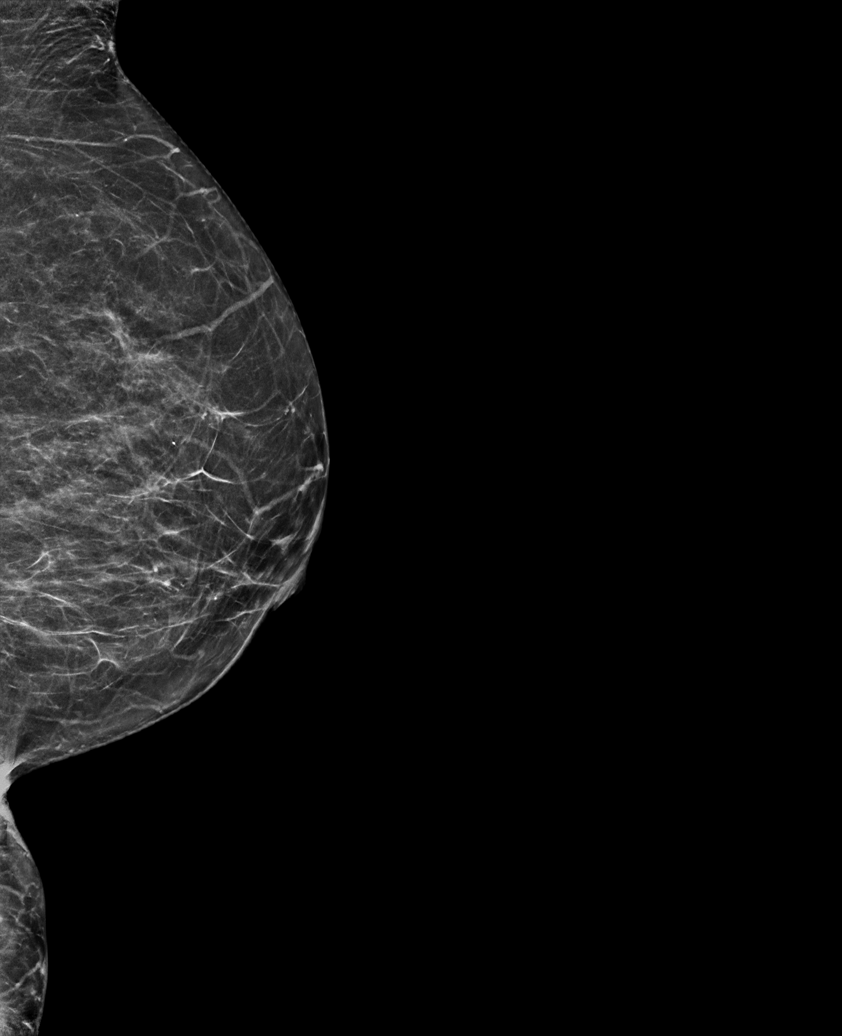

[R CC synth-2D]
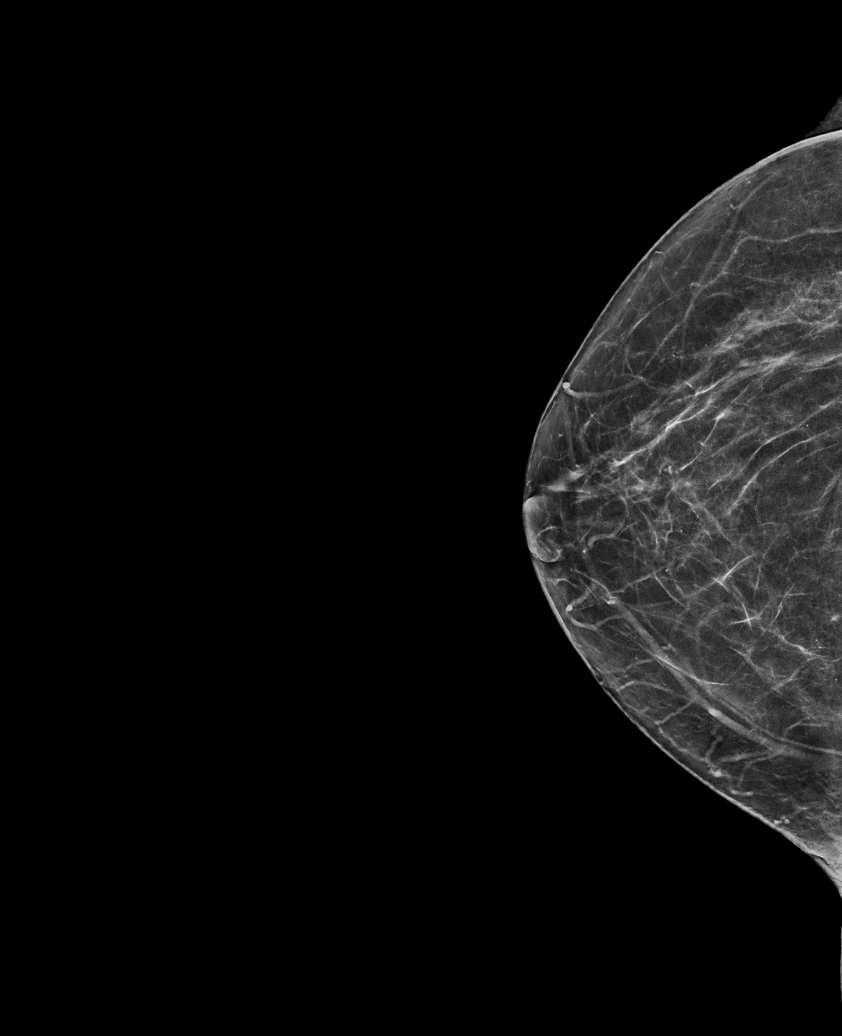

[L MLO tomo · tomo slice 33/66.0]
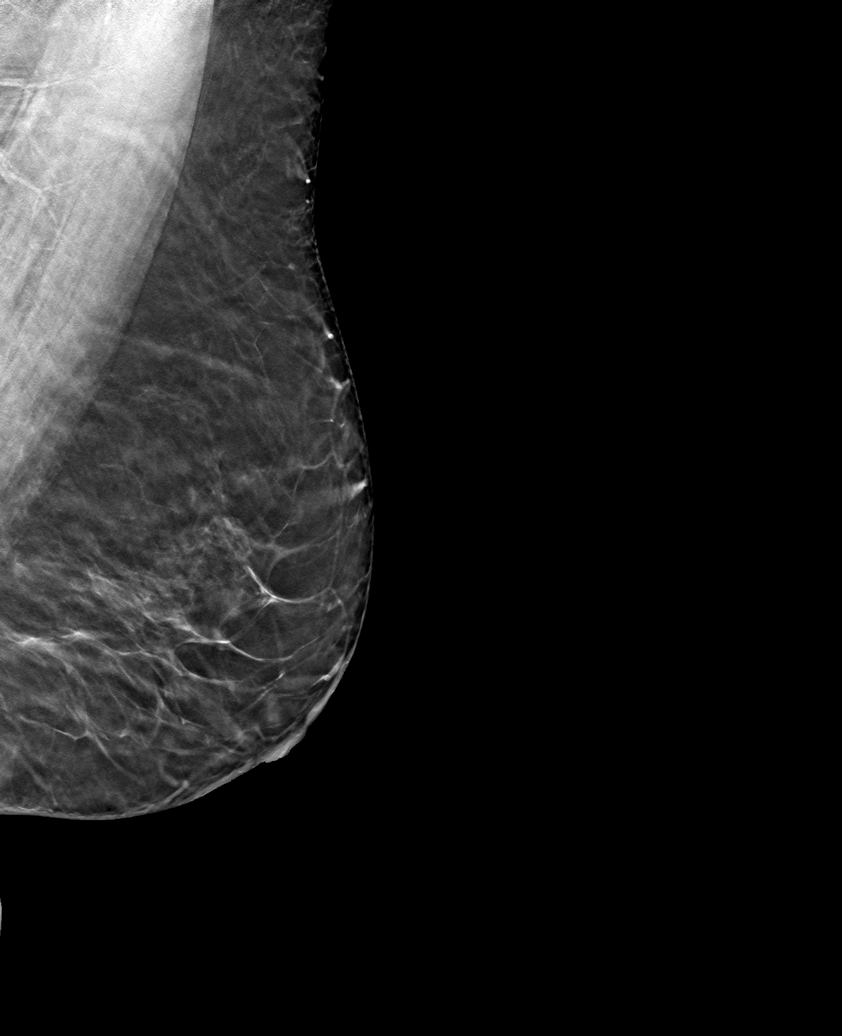

[6 of 30 positions shown; findings below may reference images not displayed]

ACR Breast Density Category b: There are scattered areas of
fibroglandular density.
FINDINGS: There are no findings suspicious for malignancy.
IMPRESSION: No mammographic evidence of malignancy. A result letter of this
screening mammogram will be mailed directly to the patient.

RECOMMENDATION:
Screening mammogram in one year. (Code:51-O-LD2)

BI-RADS CATEGORY  1: Negative.

## 2022-10-13 ENCOUNTER — Ambulatory Visit: Payer: Medicare Other | Admitting: Family Medicine

## 2022-10-13 NOTE — Progress Notes (Deleted)
Subjective: CC:*** PCP: Raliegh Ip, DO [id number] M Patmon is a 82 y.o. female presenting to clinic today for:  1. ***   ROS: Per HPI  No Known Allergies Past Medical History:  Diagnosis Date   GERD (gastroesophageal reflux disease)    Hyperlipidemia    Osteopenia    Osteoporosis     Current Outpatient Medications:    busPIRone (BUSPAR) 5 MG tablet, Take 1 tablet (5 mg total) by mouth 2 (two) times daily. For stress/anxiety (Patient not taking: Reported on 05/04/2022), Disp: 180 tablet, Rfl: 3   Cholecalciferol 1.25 MG (50000 UT) capsule, Take 1 capsule (50,000 Units total) by mouth every 7 (seven) days., Disp: 12 capsule, Rfl: 3   dapagliflozin propanediol (FARXIGA) 5 MG TABS tablet, Take 2 tablets (10 mg total) by mouth daily before breakfast., Disp: 90 tablet, Rfl: 5   diclofenac Sodium (VOLTAREN) 1 % GEL, Apply 4 g topically 4 (four) times daily. (knee pain).  If not covered please show her where the OTC version is, Disp: 400 g, Rfl: 2   rosuvastatin (CRESTOR) 10 MG tablet, Take 1 tablet (10 mg total) by mouth daily., Disp: 90 tablet, Rfl: 3   tiZANidine (ZANAFLEX) 4 MG tablet, Take 1 tablet (4 mg total) by mouth every 8 (eight) hours as needed for muscle spasms., Disp: 90 tablet, Rfl: 1 Social History   Socioeconomic History   Marital status: Married    Spouse name: Dimas Aguas   Number of children: 6   Years of education: 11   Highest education level: 11th grade  Occupational History   Occupation: retired    Associate Professor: [employer]  Tobacco Use   Smoking status: Never   Smokeless tobacco: Never  Vaping Use   Vaping status: Never Used  Substance and Sexual Activity   Alcohol use: No   Drug use: No   Sexual activity: Yes  Other Topics Concern   Not on file  Social History Narrative   Not on file   Social Determinants of Health   Financial Resource Strain: Low Risk  (05/04/2022)   Overall Financial Resource Strain (CARDIA)    Difficulty of Paying Living  Expenses: Not very hard  Food Insecurity: No Food Insecurity (05/04/2022)   Hunger Vital Sign    Worried About Running Out of Food in the Last Year: Never true    Ran Out of Food in the Last Year: Never true  Transportation Needs: No Transportation Needs (05/04/2022)   PRAPARE - Administrator, Civil Service (Medical): No    Lack of Transportation (Non-Medical): No  Physical Activity: Insufficiently Active (05/04/2022)   Exercise Vital Sign    Days of Exercise per Week: 3 days    Minutes of Exercise per Session: 30 min  Stress: No Stress Concern Present (05/04/2022)   [employer] of Occupational Health - Occupational Stress Questionnaire    Feeling of Stress : Only a little  Social Connections: Moderately Integrated (05/04/2022)   Social Connection and Isolation Panel [NHANES]    Frequency of Communication with Friends and Family: More than three times a week    Frequency of Social Gatherings with Friends and Family: More than three times a week    Attends Religious Services: More than 4 times per year    Active Member of [employer] or Organizations: No    Attends Banker Meetings: Never    Marital Status: Married  Catering manager Violence: Not At Risk (04/27/2022)   Humiliation, Afraid, Rape, and Kick  questionnaire    Fear of Current or Ex-Partner: No    Emotionally Abused: No    Physically Abused: No    Sexually Abused: No   Family History  Problem Relation Age of Onset   Cancer Mother        BREAST REMOVED   Breast cancer Mother    Leukemia Sister    Bladder Cancer Brother    Cancer Son    Arthritis Brother     Objective: Office vital signs reviewed. There were no vitals taken for this visit.  Physical Examination:  General: Awake, alert, *** nourished, No acute distress HEENT: Normal    Neck: No masses palpated. No lymphadenopathy    Ears: Tympanic membranes intact, normal light reflex, no erythema, no bulging    Eyes: PERRLA, extraocular  membranes intact, sclera ***    Nose: nasal turbinates moist, *** nasal discharge    Throat: moist mucus membranes, no erythema, *** tonsillar exudate.  Airway is patent Cardio: regular rate and rhythm, S1S2 heard, no murmurs appreciated Pulm: clear to auscultation bilaterally, no wheezes, rhonchi or rales; normal work of breathing on room air GI: soft, non-tender, non-distended, bowel sounds present x4, no hepatomegaly, no splenomegaly, no masses GU: external vaginal tissue ***, cervix ***, *** punctate lesions on cervix appreciated, *** discharge from cervical os, *** bleeding, *** cervical motion tenderness, *** abdominal/ adnexal masses Extremities: warm, well perfused, No edema, cyanosis or clubbing; +*** pulses bilaterally MSK: *** gait and *** station Skin: dry; intact; no rashes or lesions Neuro: *** Strength and light touch sensation grossly intact, *** DTRs ***/4  Assessment/ Plan: 81 y.o. female   Chronic kidney disease, stage 3a (HCC)  Other hyperlipidemia  ***   Timisha Mondry Hulen Skains, DO [facility] [phone]

## 2022-10-14 ENCOUNTER — Encounter: Payer: Self-pay | Admitting: Family Medicine

## 2022-11-23 ENCOUNTER — Ambulatory Visit (INDEPENDENT_AMBULATORY_CARE_PROVIDER_SITE_OTHER): Payer: Medicare Other | Admitting: Family Medicine

## 2022-11-23 ENCOUNTER — Encounter: Payer: Self-pay | Admitting: Family Medicine

## 2022-11-23 VITALS — BP 126/74 | HR 83 | Temp 98.7°F | Ht 64.0 in | Wt 207.0 lb

## 2022-11-23 DIAGNOSIS — N1831 Chronic kidney disease, stage 3a: Secondary | ICD-10-CM

## 2022-11-23 DIAGNOSIS — Z636 Dependent relative needing care at home: Secondary | ICD-10-CM | POA: Diagnosis not present

## 2022-11-23 DIAGNOSIS — Z23 Encounter for immunization: Secondary | ICD-10-CM

## 2022-11-23 NOTE — Progress Notes (Signed)
Subjective: CC: Follow-up CKD PCP: Raliegh Ip, DO [id number] M Jobe is a 82 y.o. female presenting to clinic today for:  1.  CKD 3A Patient compliant with Farxiga 10 mg daily which she gets through patient assistance program.  She reports good urine output.  Denies any dysuria, vaginitis.  No swelling.  2.  Caregiver stress Continues to deal with caregiver stress.  Her husband has advancing dementia.  She reports that he is almost like a child and constantly follows her.  She does have some help by her children who bring him to office visits but really never has a true break.  He is pleasantly demented and poses no physical risks to her.  No wandering behaviors etc.  She is grateful for this.   ROS: Per HPI  No Known Allergies Past Medical History:  Diagnosis Date   GERD (gastroesophageal reflux disease)    Hyperlipidemia    Osteopenia    Osteoporosis     Current Outpatient Medications:    busPIRone (BUSPAR) 5 MG tablet, Take 1 tablet (5 mg total) by mouth 2 (two) times daily. For stress/anxiety (Patient not taking: Reported on 05/04/2022), Disp: 180 tablet, Rfl: 3   Cholecalciferol 1.25 MG (50000 UT) capsule, Take 1 capsule (50,000 Units total) by mouth every 7 (seven) days., Disp: 12 capsule, Rfl: 3   dapagliflozin propanediol (FARXIGA) 5 MG TABS tablet, Take 2 tablets (10 mg total) by mouth daily before breakfast., Disp: 90 tablet, Rfl: 5   diclofenac Sodium (VOLTAREN) 1 % GEL, Apply 4 g topically 4 (four) times daily. (knee pain).  If not covered please show her where the OTC version is, Disp: 400 g, Rfl: 2   rosuvastatin (CRESTOR) 10 MG tablet, Take 1 tablet (10 mg total) by mouth daily., Disp: 90 tablet, Rfl: 3   tiZANidine (ZANAFLEX) 4 MG tablet, Take 1 tablet (4 mg total) by mouth every 8 (eight) hours as needed for muscle spasms., Disp: 90 tablet, Rfl: 1 Social History   Socioeconomic History   Marital status: Married    Spouse name: Dimas Aguas   Number of  children: 6   Years of education: 11   Highest education level: 11th grade  Occupational History   Occupation: retired    Associate Professor: [employer]  Tobacco Use   Smoking status: Never   Smokeless tobacco: Never  Vaping Use   Vaping status: Never Used  Substance and Sexual Activity   Alcohol use: No   Drug use: No   Sexual activity: Yes  Other Topics Concern   Not on file  Social History Narrative   Not on file   Social Determinants of Health   Financial Resource Strain: Low Risk  (05/04/2022)   Overall Financial Resource Strain (CARDIA)    Difficulty of Paying Living Expenses: Not very hard  Food Insecurity: No Food Insecurity (05/04/2022)   Hunger Vital Sign    Worried About Running Out of Food in the Last Year: Never true    Ran Out of Food in the Last Year: Never true  Transportation Needs: No Transportation Needs (05/04/2022)   PRAPARE - Administrator, Civil Service (Medical): No    Lack of Transportation (Non-Medical): No  Physical Activity: Insufficiently Active (05/04/2022)   Exercise Vital Sign    Days of Exercise per Week: 3 days    Minutes of Exercise per Session: 30 min  Stress: No Stress Concern Present (05/04/2022)   [employer] of Occupational Health - Occupational Stress Questionnaire  Feeling of Stress : Only a little  Social Connections: Moderately Integrated (05/04/2022)   Social Connection and Isolation Panel [NHANES]    Frequency of Communication with Friends and Family: More than three times a week    Frequency of Social Gatherings with Friends and Family: More than three times a week    Attends Religious Services: More than 4 times per year    Active Member of [employer] or Organizations: No    Attends Banker Meetings: Never    Marital Status: Married  Catering manager Violence: Not At Risk (04/27/2022)   Humiliation, Afraid, Rape, and Kick questionnaire    Fear of Current or Ex-Partner: No    Emotionally Abused: No    Physically  Abused: No    Sexually Abused: No   Family History  Problem Relation Age of Onset   Cancer Mother        BREAST REMOVED   Breast cancer Mother    Leukemia Sister    Bladder Cancer Brother    Cancer Son    Arthritis Brother     Objective: Office vital signs reviewed. BP 126/74   Pulse 83   Temp 98.7 F (37.1 C)   Ht 5\' 4"  (1.626 m)   Wt 207 lb (93.9 kg)   SpO2 96%   BMI 35.53 kg/m   Physical Examination:  General: Awake, alert, well nourished, No acute distress HEENT: sclera white, MMM Cardio: regular rate and rhythm, S1S2 heard, no murmurs appreciated Pulm: clear to auscultation bilaterally, no wheezes, rhonchi or rales; normal work of breathing on room air Extremities: warm, well perfused, No edema, cyanosis or clubbing; +2 pulses bilaterally       11/23/2022    3:39 PM 05/04/2022   10:47 AM 04/27/2022    9:53 AM  Depression screen PHQ 2/9  Decreased Interest 0 0 0  Down, Depressed, Hopeless 0 0 0  PHQ - 2 Score 0 0 0  Altered sleeping 0  0  Tired, decreased energy 0  0  Change in appetite 0  0  Feeling bad or failure about yourself  0  0  Trouble concentrating 0  0  Moving slowly or fidgety/restless 0  0  Suicidal thoughts 0  0  PHQ-9 Score 0  0  Difficult doing work/chores Not difficult at all  Not difficult at all      11/23/2022    3:41 PM 03/31/2022   10:17 AM 12/24/2021   11:23 AM 12/24/2020    1:41 PM  GAD 7 : Generalized Anxiety Score  Nervous, Anxious, on Edge 0 0 0 0  Control/stop worrying 0 0 0 0  Worry too much - different things 0 0 0 0  Trouble relaxing 0 0 1 0  Restless 0 0 0 0  Easily annoyed or irritable 0 0 0 0  Afraid - awful might happen 0 0 0 0  Total GAD 7 Score 0 0 1 0  Anxiety Difficulty Not difficult at all Not difficult at all Not difficult at all Somewhat difficult     Assessment/ Plan: 82 y.o. female   Chronic kidney disease, stage 3a (HCC) - Plan: Renal Function Panel  Caregiver stress  Encounter for immunization  - Plan: Flu Vaccine Trivalent High Dose (Fluad)  Check renal function.  Continue Farxiga.  Supportive care for caregiver stress.  Discussed consideration for placement if he ever becomes too much for her.  May need to consider respite care  Influenza vaccination administered  Raliegh Ip, DO [facility] [phone]

## 2022-11-24 LAB — RENAL FUNCTION PANEL
Albumin: 4.2 g/dL (ref 3.8–4.8)
BUN/Creatinine Ratio: 15 (ref 12–28)
BUN: 16 mg/dL (ref 8–27)
CO2: 23 mmol/L (ref 20–29)
Calcium: 9.4 mg/dL (ref 8.7–10.3)
Chloride: 102 mmol/L (ref 96–106)
Creatinine, Ser: 1.08 mg/dL — ABNORMAL HIGH (ref 0.57–1.00)
Glucose: 96 mg/dL (ref 70–99)
Phosphorus: 3.4 mg/dL (ref 3.0–4.3)
Potassium: 4.2 mmol/L (ref 3.5–5.2)
Sodium: 139 mmol/L (ref 134–144)
eGFR: 52 mL/min/{1.73_m2} — ABNORMAL LOW (ref >59)

## 2022-11-27 ENCOUNTER — Telehealth: Payer: Self-pay

## 2022-11-27 DIAGNOSIS — N1831 Chronic kidney disease, stage 3a: Secondary | ICD-10-CM

## 2022-11-27 MED ORDER — DAPAGLIFLOZIN PROPANEDIOL 10 MG PO TABS
10.0000 mg | ORAL_TABLET | Freq: Every day | ORAL | 3 refills | Status: DC
Start: 2022-11-27 — End: 2023-05-24

## 2022-11-27 NOTE — Telephone Encounter (Signed)
Rec'd refill request fax from AZ&ME for patients Marcelline Deist (unsure why, she had plenty on last refill sent).   Please escribe to [facility] pharmacy.

## 2022-12-08 ENCOUNTER — Other Ambulatory Visit: Payer: Self-pay

## 2022-12-08 NOTE — Patient Outreach (Signed)
  Care Management   Visit Note  12/08/2022 Name: Jodi Allen MRN: [id number] DOB: [birth year]  Subjective: Jodi Allen is a 82 y.o. year old female who is a primary care patient of Raliegh Ip, DO. The Care Management team was consulted for assistance.      Brief outreach with Jodi Allen today.  Plan:

## 2022-12-14 ENCOUNTER — Telehealth: Payer: Self-pay

## 2022-12-14 ENCOUNTER — Other Ambulatory Visit: Payer: Self-pay

## 2022-12-14 NOTE — Patient Outreach (Signed)
  Care Management   Outreach Note  12/14/2022 Name: Jodi Allen MRN: [id number] DOB: 1940-07-12  An unsuccessful outreach attempt was made today for a scheduled Care Management visit.   Patient's phone line was busy. Will make additional attempts to reach patient today.   [facility]  [facility], [facility] RN Care Manager Direct Dial: [phone] Website: [email or web address]

## 2023-01-26 ENCOUNTER — Other Ambulatory Visit: Payer: Self-pay

## 2023-02-25 ENCOUNTER — Other Ambulatory Visit: Payer: Self-pay

## 2023-02-25 ENCOUNTER — Telehealth: Payer: Self-pay

## 2023-02-25 NOTE — Patient Outreach (Signed)
  Care Management   Outreach Note  02/25/2023 Name: Jodi Allen MRN: [id number] DOB: [birth year]  An unsuccessful outreach attempt was made today for a scheduled Care Management visit.   Follow Up Plan:  A HIPAA compliant phone message was left for the patient providing contact information and requesting a return call.      Juanell Fairly [facility] RN Care Manager Direct Dial: [phone]  Fax: [phone] Website: [email or web address]

## 2023-04-28 NOTE — Progress Notes (Signed)
 This encounter was created in error - please disregard.

## 2023-05-10 ENCOUNTER — Other Ambulatory Visit: Payer: Self-pay | Admitting: Family Medicine

## 2023-05-10 DIAGNOSIS — Z1231 Encounter for screening mammogram for malignant neoplasm of breast: Secondary | ICD-10-CM

## 2023-05-17 ENCOUNTER — Ambulatory Visit
Admission: RE | Admit: 2023-05-17 | Discharge: 2023-05-17 | Disposition: A | Discharge status: Home / Self Care | Payer: Medicare Other | Source: Ambulatory Visit | Attending: Family Medicine | Admitting: Family Medicine

## 2023-05-17 DIAGNOSIS — Z1231 Encounter for screening mammogram for malignant neoplasm of breast: Secondary | ICD-10-CM | POA: Diagnosis not present

## 2023-05-24 ENCOUNTER — Ambulatory Visit: Payer: Medicare Other | Admitting: Family Medicine

## 2023-05-24 ENCOUNTER — Encounter: Payer: Self-pay | Admitting: Family Medicine

## 2023-05-24 VITALS — BP 127/63 | HR 98 | Temp 98.6°F | Ht 64.0 in | Wt 205.2 lb

## 2023-05-24 DIAGNOSIS — Z0001 Encounter for general adult medical examination with abnormal findings: Secondary | ICD-10-CM

## 2023-05-24 DIAGNOSIS — E7800 Pure hypercholesterolemia, unspecified: Secondary | ICD-10-CM

## 2023-05-24 DIAGNOSIS — M1712 Unilateral primary osteoarthritis, left knee: Secondary | ICD-10-CM

## 2023-05-24 DIAGNOSIS — Z636 Dependent relative needing care at home: Secondary | ICD-10-CM | POA: Diagnosis not present

## 2023-05-24 DIAGNOSIS — Z1159 Encounter for screening for other viral diseases: Secondary | ICD-10-CM | POA: Diagnosis not present

## 2023-05-24 DIAGNOSIS — M545 Low back pain, unspecified: Secondary | ICD-10-CM

## 2023-05-24 DIAGNOSIS — N1831 Chronic kidney disease, stage 3a: Secondary | ICD-10-CM

## 2023-05-24 DIAGNOSIS — G8929 Other chronic pain: Secondary | ICD-10-CM | POA: Diagnosis not present

## 2023-05-24 DIAGNOSIS — Z0000 Encounter for general adult medical examination without abnormal findings: Secondary | ICD-10-CM

## 2023-05-24 DIAGNOSIS — I10 Essential (primary) hypertension: Secondary | ICD-10-CM

## 2023-05-24 LAB — LIPID PANEL

## 2023-05-24 MED ORDER — ROSUVASTATIN CALCIUM 10 MG PO TABS: 10.0000 mg | ORAL_TABLET | Freq: Every day | ORAL | 3 refills | Status: DC

## 2023-05-24 MED ORDER — DICLOFENAC SODIUM 1 % EX GEL: 4.0000 g | Freq: Four times a day (QID) | CUTANEOUS | 2 refills | Status: DC

## 2023-05-24 MED ORDER — DAPAGLIFLOZIN PROPANEDIOL 10 MG PO TABS: 10.0000 mg | ORAL_TABLET | Freq: Every day | ORAL | 3 refills | Status: DC

## 2023-05-24 MED ORDER — TIZANIDINE HCL 4 MG PO TABS: 4.0000 mg | ORAL_TABLET | Freq: Three times a day (TID) | ORAL | 1 refills | Status: DC | PRN

## 2023-05-24 NOTE — Patient Instructions (Addendum)
 No colonoscopy needed.   Preventive Care 53 Years and Older, Female Preventive care refers to lifestyle choices and visits with your health care provider that can promote health and wellness. Preventive care visits are also called wellness exams. What can I expect for my preventive care visit? Counseling Your health care provider may ask you questions about your: Medical history, including: Past medical problems. Family medical history. Pregnancy and menstrual history. History of falls. Current health, including: Memory and ability to understand (cognition). Emotional well-being. Home life and relationship well-being. Sexual activity and sexual health. Lifestyle, including: Alcohol, nicotine or tobacco, and drug use. Access to firearms. Diet, exercise, and sleep habits. Work and work Astronomer. Sunscreen use. Safety issues such as seatbelt and bike helmet use. Physical exam Your health care provider will check your: Height and weight. These may be used to calculate your BMI (body mass index). BMI is a measurement that tells if you are at a healthy weight. Waist circumference. This measures the distance around your waistline. This measurement also tells if you are at a healthy weight and may help predict your risk of certain diseases, such as type 2 diabetes and high blood pressure. Heart rate and blood pressure. Body temperature. Skin for abnormal spots. What immunizations do I need?  Vaccines are usually given at various ages, according to a schedule. Your health care provider will recommend vaccines for you based on your age, medical history, and lifestyle or other factors, such as travel or where you work. What tests do I need? Screening Your health care provider may recommend screening tests for certain conditions. This may include: Lipid and cholesterol levels. Hepatitis C test. Hepatitis B test. HIV (human immunodeficiency virus) test. STI (sexually transmitted  infection) testing, if you are at risk. Lung cancer screening. Colorectal cancer screening. Diabetes screening. This is done by checking your blood sugar (glucose) after you have not eaten for a while (fasting). Mammogram. Talk with your health care provider about how often you should have regular mammograms. BRCA-related cancer screening. This may be done if you have a family history of breast, ovarian, tubal, or peritoneal cancers. Bone density scan. This is done to screen for osteoporosis. Talk with your health care provider about your test results, treatment options, and if necessary, the need for more tests. Follow these instructions at home: Eating and drinking  Eat a diet that includes fresh fruits and vegetables, whole grains, lean protein, and low-fat dairy products. Limit your intake of foods with high amounts of sugar, saturated fats, and salt. Take vitamin and mineral supplements as recommended by your health care provider. Do not drink alcohol if your health care provider tells you not to drink. If you drink alcohol: Limit how much you have to 0-1 drink a day. Know how much alcohol is in your drink. In the [place] one drink equals one 12 oz bottle of beer (355 mL), one 5 oz glass of wine (148 mL), or one 1 oz glass of hard liquor (44 mL). Lifestyle Brush your teeth every morning and night with fluoride toothpaste. Floss one time each day. Exercise for at least 30 minutes 5 or more days each week. Do not use any products that contain nicotine or tobacco. These products include cigarettes, chewing tobacco, and vaping devices, such as e-cigarettes. If you need help quitting, ask your health care provider. Do not use drugs. If you are sexually active, practice safe sex. Use a condom or other form of protection in order to prevent STIs. Take  aspirin only as told by your health care provider. Make sure that you understand how much to take and what form to take. Work with your health care  provider to find out whether it is safe and beneficial for you to take aspirin daily. Ask your health care provider if you need to take a cholesterol-lowering medicine (statin). Find healthy ways to manage stress, such as: Meditation, yoga, or listening to music. Journaling. Talking to a trusted person. Spending time with friends and family. Minimize exposure to UV radiation to reduce your risk of skin cancer. Safety Always wear your seat belt while driving or riding in a vehicle. Do not drive: If you have been drinking alcohol. Do not ride with someone who has been drinking. When you are tired or distracted. While texting. If you have been using any mind-altering substances or drugs. Wear a helmet and other protective equipment during sports activities. If you have firearms in your house, make sure you follow all gun safety procedures. What's next? Visit your health care provider once a year for an annual wellness visit. Ask your health care provider how often you should have your eyes and teeth checked. Stay up to date on all vaccines. This information is not intended to replace advice given to you by your health care provider. Make sure you discuss any questions you have with your health care provider. Document Revised: 07/17/2020 Document Reviewed: 07/17/2020 Elsevier Patient Education  2024 [employer].

## 2023-05-24 NOTE — Progress Notes (Signed)
 Jodi Allen is a 83 y.o. female presents to office today for annual physical exam examination.    Concerns today include: 1.  None.  She is doing okay.  Still has some stress related to caring for her husband who suffers from dementia.  BuSpar  does help with her nerves  Occupation: retired, Marital status: married, Substance use: none Health Maintenance Due  Topic Date Due   Zoster Vaccines- Shingrix (1 of 2) Never done   COVID-19 Vaccine (4 - 2024-25 season) 10/04/2022   Medicare Annual Wellness (AWV)  04/27/2023   Refills needed today: all  Immunization History  Administered Date(s) Administered   Fluad Trivalent(High Dose 65+) 11/23/2022   Influenza, High Dose Seasonal PF 10/25/2018   Influenza, Seasonal, Injecte, Preservative Fre 12/03/2012   Influenza,inj,Quad PF,6+ Mos 03/10/2016   Influenza-Unspecified 12/03/2012   Moderna SARS-COV2 Booster Vaccination 03/06/2020   Moderna Sars-Covid-2 Vaccination 06/19/2019, 07/17/2019   PNEUMOCOCCAL CONJUGATE-20 06/24/2021   Pneumococcal Conjugate-13 04/14/2017   Tdap 06/27/2015   Past Medical History:  Diagnosis Date   GERD (gastroesophageal reflux disease)    Hyperlipidemia    Osteopenia    Osteoporosis    Social History   Socioeconomic History   Marital status: Married    Spouse name: Gwen Lek   Number of children: 6   Years of education: 11   Highest education level: 11th grade  Occupational History   Occupation: retired    Associate Professor: [employer]  Tobacco Use   Smoking status: Never   Smokeless tobacco: Never  Vaping Use   Vaping status: Never Used  Substance and Sexual Activity   Alcohol use: No   Drug use: No   Sexual activity: Yes  Other Topics Concern   Not on file  Social History Narrative   Not on file   Social Drivers of Health   Financial Resource Strain: Low Risk  (05/04/2022)   Overall Financial Resource Strain (CARDIA)    Difficulty of Paying Living Expenses: Not very hard  Food Insecurity: No  Food Insecurity (05/04/2022)   Hunger Vital Sign    Worried About Running Out of Food in the Last Year: Never true    Ran Out of Food in the Last Year: Never true  Transportation Needs: No Transportation Needs (05/04/2022)   PRAPARE - Administrator, Civil Service (Medical): No    Lack of Transportation (Non-Medical): No  Physical Activity: Insufficiently Active (05/04/2022)   Exercise Vital Sign    Days of Exercise per Week: 3 days    Minutes of Exercise per Session: 30 min  Stress: No Stress Concern Present (05/04/2022)   [employer] of Occupational Health - Occupational Stress Questionnaire    Feeling of Stress : Only a little  Social Connections: Moderately Integrated (05/04/2022)   Social Connection and Isolation Panel [NHANES]    Frequency of Communication with Friends and Family: More than three times a week    Frequency of Social Gatherings with Friends and Family: More than three times a week    Attends Religious Services: More than 4 times per year    Active Member of [employer] or Organizations: No    Attends Banker Meetings: Never    Marital Status: Married  Catering manager Violence: Not At Risk (04/27/2022)   Humiliation, Afraid, Rape, and Kick questionnaire    Fear of Current or Ex-Partner: No    Emotionally Abused: No    Physically Abused: No    Sexually Abused: No   Past Surgical  History:  Procedure Laterality Date   ABDOMINAL HYSTERECTOMY     Family History  Problem Relation Age of Onset   Cancer Mother        BREAST REMOVED   Breast cancer Mother    Leukemia Sister    Bladder Cancer Brother    Cancer Son    Arthritis Brother     Current Outpatient Medications:    busPIRone  (BUSPAR ) 5 MG tablet, Take 1 tablet (5 mg total) by mouth 2 (two) times daily. For stress/anxiety, Disp: 180 tablet, Rfl: 3   Cholecalciferol  1.25 MG (50000 UT) capsule, Take 1 capsule (50,000 Units total) by mouth every 7 (seven) days., Disp: 12 capsule, Rfl: 3    dapagliflozin  propanediol (FARXIGA ) 10 MG TABS tablet, Take 1 tablet (10 mg total) by mouth daily before breakfast., Disp: 90 tablet, Rfl: 3   diclofenac  Sodium (VOLTAREN ) 1 % GEL, Apply 4 g topically 4 (four) times daily. (knee pain).  If not covered please show her where the OTC version is, Disp: 400 g, Rfl: 2   rosuvastatin  (CRESTOR ) 10 MG tablet, Take 1 tablet (10 mg total) by mouth daily., Disp: 90 tablet, Rfl: 3   tiZANidine  (ZANAFLEX ) 4 MG tablet, Take 1 tablet (4 mg total) by mouth every 8 (eight) hours as needed for muscle spasms., Disp: 90 tablet, Rfl: 1  No Known Allergies   ROS: Review of Systems A comprehensive review of systems was negative except for: Occasional arthritis that is relieved by current medicines    Physical exam BP 127/63   Pulse 98   Temp 98.6 F (37 C)   Ht 5\' 4"  (1.626 m)   Wt 205 lb 3.2 oz (93.1 kg)   SpO2 98%   BMI 35.22 kg/m  General appearance: alert, cooperative, appears stated age, no distress, and morbidly obese Head: Normocephalic, without obvious abnormality, atraumatic Eyes: negative findings: lids and lashes normal and conjunctivae and sclerae normal Ears: normal TM's and external ear canals both ears Nose: Nares normal. Septum midline. Mucosa normal. No drainage or sinus tenderness. Throat: lips, mucosa, and tongue normal; teeth and gums normal Neck: no adenopathy, supple, symmetrical, trachea midline, and thyroid  not enlarged, symmetric, no tenderness/mass/nodules Back: symmetric, no curvature. ROM normal. No CVA tenderness. Lungs: clear to auscultation bilaterally Heart: regular rate and rhythm, S1, S2 normal, no murmur, click, rub or gallop Abdomen: soft, non-tender; bowel sounds normal; no masses,  no organomegaly Extremities: extremities normal, atraumatic, no cyanosis or edema Pulses: 2+ and symmetric Skin: Skin color, texture, turgor normal. No rashes or lesions Lymph nodes: Cervical, supraclavicular, and axillary nodes  normal. Neurologic: Grossly normal      05/24/2023    8:50 AM 11/23/2022    3:39 PM 05/04/2022   10:47 AM  Depression screen PHQ 2/9  Decreased Interest 0 0 0  Down, Depressed, Hopeless 0 0 0  PHQ - 2 Score 0 0 0  Altered sleeping 0 0   Tired, decreased energy 0 0   Change in appetite 0 0   Feeling bad or failure about yourself  0 0   Trouble concentrating 0 0   Moving slowly or fidgety/restless 0 0   Suicidal thoughts 0 0   PHQ-9 Score 0 0   Difficult doing work/chores Not difficult at all Not difficult at all       05/24/2023    8:50 AM 11/23/2022    3:41 PM 03/31/2022   10:17 AM 12/24/2021   11:23 AM  GAD 7 : Generalized Anxiety  Score  Nervous, Anxious, on Edge 0 0 0 0  Control/stop worrying 0 0 0 0  Worry too much - different things 0 0 0 0  Trouble relaxing 0 0 0 1  Restless 0 0 0 0  Easily annoyed or irritable 0 0 0 0  Afraid - awful might happen 0 0 0 0  Total GAD 7 Score 0 0 0 1  Anxiety Difficulty Not difficult at all Not difficult at all Not difficult at all Not difficult at all     Assessment/ Plan: Jodi Allen here for annual physical exam.   Annual physical exam  Need for hepatitis C screening test - Plan: Hepatitis C Antibody  Essential hypertension - Plan: CMP14+EGFR  Pure hypercholesterolemia - Plan: CMP14+EGFR, TSH, Lipid Panel, rosuvastatin  (CRESTOR ) 10 MG tablet  Chronic kidney disease, stage 3a (HCC) - Plan: CMP14+EGFR, VITAMIN D  25 Hydroxy (Vit-D Deficiency, Fractures), CBC with Differential, dapagliflozin  propanediol (FARXIGA ) 10 MG TABS tablet  Primary osteoarthritis of left knee - Plan: diclofenac  Sodium (VOLTAREN ) 1 % GEL  Chronic right-sided low back pain without sciatica - Plan: tiZANidine  (ZANAFLEX ) 4 MG tablet  Caregiver stress  Fasting labs collected today.  She declines vaccination.  She has aged out of colonoscopy.  Agreeable hepatitis C screening and this was obtained.  No known exposures.  Meds have been renewed.  All  chronic issues are stable  Counseled on healthy lifestyle choices, including diet (rich in fruits, vegetables and lean meats and low in salt and simple carbohydrates) and exercise (at least 30 minutes of moderate physical activity daily).  Patient to follow up 84m  Lauryn Lizardi M. Bonnell Butcher, DO

## 2023-05-25 ENCOUNTER — Encounter: Payer: Self-pay | Admitting: Family Medicine

## 2023-05-25 LAB — CMP14+EGFR
ALT: 14 IU/L (ref 0–32)
AST: 23 IU/L (ref 0–40)
Albumin: 4.2 g/dL (ref 3.8–4.8)
Alkaline Phosphatase: 88 IU/L (ref 44–121)
BUN/Creatinine Ratio: 17 (ref 12–28)
BUN: 14 mg/dL (ref 8–27)
Bilirubin Total: 0.4 mg/dL (ref 0.0–1.2)
CO2: 24 mmol/L (ref 20–29)
Calcium: 9.3 mg/dL (ref 8.7–10.3)
Chloride: 102 mmol/L (ref 96–106)
Creatinine, Ser: 0.81 mg/dL (ref 0.57–1.00)
Globulin, Total: 2.5 g/dL (ref 1.5–4.5)
Glucose: 86 mg/dL (ref 70–99)
Potassium: 4.4 mmol/L (ref 3.5–5.2)
Sodium: 138 mmol/L (ref 134–144)
Total Protein: 6.7 g/dL (ref 6.0–8.5)
eGFR: 73 mL/min/{1.73_m2} (ref >59)

## 2023-05-25 LAB — HEPATITIS C ANTIBODY

## 2023-05-25 LAB — CBC WITH DIFFERENTIAL/PLATELET
Basophils Absolute: 0 10*3/uL (ref 0.0–0.2)
Basos: 1 %
EOS (ABSOLUTE): 0.1 10*3/uL (ref 0.0–0.4)
Eos: 2 %
Hematocrit: 41.7 % (ref 34.0–46.6)
Hemoglobin: 13.7 g/dL (ref 11.1–15.9)
Immature Grans (Abs): 0 10*3/uL (ref 0.0–0.1)
Immature Granulocytes: 0 %
Lymphocytes Absolute: 0.9 10*3/uL (ref 0.7–3.1)
Lymphs: 24 %
MCH: 28.2 pg (ref 26.6–33.0)
MCHC: 32.9 g/dL (ref 31.5–35.7)
MCV: 86 fL (ref 79–97)
Monocytes Absolute: 0.3 10*3/uL (ref 0.1–0.9)
Monocytes: 8 %
Neutrophils Absolute: 2.5 10*3/uL (ref 1.4–7.0)
Neutrophils: 65 %
Platelets: 191 10*3/uL (ref 150–450)
RBC: 4.85 x10E6/uL (ref 3.77–5.28)
RDW: 13.6 % (ref 11.7–15.4)
WBC: 3.8 10*3/uL (ref 3.4–10.8)

## 2023-05-25 LAB — LIPID PANEL
Cholesterol, Total: 203 mg/dL — ABNORMAL HIGH (ref 100–199)
HDL: 53 mg/dL (ref >39)
LDL CALC COMMENT:: 3.8 ratio (ref 0.0–4.4)
LDL Chol Calc (NIH): 137 mg/dL — ABNORMAL HIGH (ref 0–99)
Triglycerides: 74 mg/dL (ref 0–149)
VLDL Cholesterol Cal: 13 mg/dL (ref 5–40)

## 2023-05-25 LAB — VITAMIN D 25 HYDROXY (VIT D DEFICIENCY, FRACTURES): Vit D, 25-Hydroxy: 28 ng/mL — ABNORMAL LOW (ref 30.0–100.0)

## 2023-05-25 LAB — TSH: TSH: 2.6 u[IU]/mL (ref 0.450–4.500)

## 2023-07-23 DIAGNOSIS — H40033 Anatomical narrow angle, bilateral: Secondary | ICD-10-CM | POA: Diagnosis not present

## 2023-07-23 DIAGNOSIS — H2513 Age-related nuclear cataract, bilateral: Secondary | ICD-10-CM | POA: Diagnosis not present

## 2023-09-14 ENCOUNTER — Ambulatory Visit

## 2023-09-14 VITALS — BP 127/63 | HR 98 | Ht 64.0 in | Wt 205.0 lb

## 2023-09-14 DIAGNOSIS — Z0000 Encounter for general adult medical examination without abnormal findings: Secondary | ICD-10-CM | POA: Diagnosis not present

## 2023-09-14 DIAGNOSIS — Z13820 Encounter for screening for osteoporosis: Secondary | ICD-10-CM

## 2023-09-14 NOTE — Patient Instructions (Addendum)
 Jodi Allen , Thank you for taking time out of your busy schedule to complete your Annual Wellness Visit with me. I enjoyed our conversation and look forward to speaking with you again next year. I, as well as your care team,  appreciate your ongoing commitment to your health goals. Please review the following plan we discussed and let me know if I can assist you in the future. Your Game plan/ To Do List    Referrals: If you haven't heard from the office you've been referred to, please reach out to them at the phone provided.   Follow up Visits: We will see or speak with you next year for your Next Medicare AWV with our clinical staff on 09/14/24 at 11:20a.m. Have you seen your provider in the last 6 months (3 months if uncontrolled diabetes)? Yes  Clinician Recommendations:  Aim for 30 minutes of exercise or brisk walking, 6-8 glasses of water, and 5 servings of fruits and vegetables each day.       This is a list of the screenings recommended for you:  Health Maintenance  Topic Date Due   Zoster (Shingles) Vaccine (1 of 2) Never done   COVID-19 Vaccine (4 - 2024-25 season) 10/04/2022   Medicare Annual Wellness Visit  04/27/2023   DEXA scan (bone density measurement)  05/08/2023   Flu Shot  09/03/2023   Colon Cancer Screening  11/23/2023*   DTaP/Tdap/Td vaccine (2 - Td or Tdap) 06/26/2025   Pneumococcal Vaccine for age over 66  Completed   Hepatitis C Screening  Completed   Hepatitis B Vaccine  Aged Out   HPV Vaccine  Aged Out   Meningitis B Vaccine  Aged Out  *Topic was postponed. The date shown is not the original due date.    Advanced directives: (Declined) Advance directive discussed with you today. Even though you declined this today, please call our office should you change your mind, and we can give you the proper paperwork for you to fill out. Advance Care Planning is important because it:  [x]  Makes sure you receive the medical care that is consistent with your values,  goals, and preferences  [x]  It provides guidance to your family and loved ones and reduces their decisional burden about whether or not they are making the right decisions based on your wishes.  Follow the link provided in your after visit summary or read over the paperwork we have mailed to you to help you started getting your Advance Directives in place. If you need assistance in completing these, please reach out to us  so that we can help you!  See attachments for Preventive Care and Fall Prevention Tips.

## 2023-09-14 NOTE — Progress Notes (Signed)
 Subjective:   Jodi Allen is a 83 y.o. who presents for a Medicare Wellness preventive visit.  As a reminder, Annual Wellness Visits don't include a physical exam, and some assessments may be limited, especially if this visit is performed virtually. We may recommend an in-person follow-up visit with your provider if needed.  Visit Complete: Virtual I connected with  Metha M Scoggins on 09/14/23 by a audio enabled telemedicine application and verified that I am speaking with the correct person using two identifiers.  Patient Location: Home  Provider Location: Home Office  I discussed the limitations of evaluation and management by telemedicine. The patient expressed understanding and agreed to proceed.  Vital Signs: Because this visit was a virtual/telehealth visit, some criteria may be missing or patient reported. Any vitals not documented were not able to be obtained and vitals that have been documented are patient reported.  VideoDeclined- This patient declined Librarian, academic. Therefore the visit was completed with audio only.  Persons Participating in Visit: Patient.  AWV Questionnaire: No: Patient Medicare AWV questionnaire was not completed prior to this visit.  Cardiac Risk Factors include: advanced age (>52men, >92 women);dyslipidemia;hypertension     Objective:    Today's Vitals   09/14/23 1111  BP: 127/63  Pulse: 98  Weight: 205 lb (93 kg)  Height: 5' 4 (1.626 m)   Body mass index is 35.19 kg/m.     09/14/2023   11:03 AM 04/27/2022    9:54 AM 04/21/2021    9:53 AM 02/16/2020   11:23 AM 09/23/2018    8:33 AM  Advanced Directives  Does Patient Have a Medical Advance Directive? No No No No No  Would patient like information on creating a medical advance directive?  No - Patient declined No - Patient declined No - Patient declined No - Patient declined      Data saved with a previous flowsheet row definition    Current  Medications (verified) Outpatient Encounter Medications as of 09/14/2023  Medication Sig   busPIRone  (BUSPAR ) 5 MG tablet Take 1 tablet (5 mg total) by mouth 2 (two) times daily. For stress/anxiety   Cholecalciferol  1.25 MG (50000 UT) capsule Take 1 capsule (50,000 Units total) by mouth every 7 (seven) days.   dapagliflozin  propanediol (FARXIGA ) 10 MG TABS tablet Take 1 tablet (10 mg total) by mouth daily before breakfast.   diclofenac  Sodium (VOLTAREN ) 1 % GEL Apply 4 g topically 4 (four) times daily. (knee pain).  If not covered please show her where the OTC version is   rosuvastatin  (CRESTOR ) 10 MG tablet Take 1 tablet (10 mg total) by mouth daily.   tiZANidine  (ZANAFLEX ) 4 MG tablet Take 1 tablet (4 mg total) by mouth every 8 (eight) hours as needed for muscle spasms.   No facility-administered encounter medications on file as of 09/14/2023.    Allergies (verified) Patient has no known allergies.   History: Past Medical History:  Diagnosis Date   GERD (gastroesophageal reflux disease)    Hyperlipidemia    Osteopenia    Osteoporosis    Past Surgical History:  Procedure Laterality Date   ABDOMINAL HYSTERECTOMY     Family History  Problem Relation Age of Onset   Cancer Mother        BREAST REMOVED   Breast cancer Mother    Leukemia Sister    Bladder Cancer Brother    Cancer Son    Arthritis Brother    Social History   Socioeconomic History  Marital status: Married    Spouse name: Kayla   Number of children: 6   Years of education: 11   Highest education level: 11th grade  Occupational History   Occupation: retired    Associate Professor: [employer]  Tobacco Use   Smoking status: Never   Smokeless tobacco: Never  Vaping Use   Vaping status: Never Used  Substance and Sexual Activity   Alcohol use: No   Drug use: No   Sexual activity: Yes  Other Topics Concern   Not on file  Social History Narrative   Not on file   Social Drivers of Health   Financial Resource Strain:  Low Risk  (09/14/2023)   Overall Financial Resource Strain (CARDIA)    Difficulty of Paying Living Expenses: Not hard at all  Food Insecurity: No Food Insecurity (09/14/2023)   Hunger Vital Sign    Worried About Running Out of Food in the Last Year: Never true    Ran Out of Food in the Last Year: Never true  Transportation Needs: No Transportation Needs (09/14/2023)   PRAPARE - Administrator, Civil Service (Medical): No    Lack of Transportation (Non-Medical): No  Physical Activity: Inactive (09/14/2023)   Exercise Vital Sign    Days of Exercise per Week: 0 days    Minutes of Exercise per Session: 0 min  Stress: No Stress Concern Present (09/14/2023)   [employer] of Occupational Health - Occupational Stress Questionnaire    Feeling of Stress: Only a little  Social Connections: Moderately Integrated (05/04/2022)   Social Connection and Isolation Panel    Frequency of Communication with Friends and Family: More than three times a week    Frequency of Social Gatherings with Friends and Family: More than three times a week    Attends Religious Services: More than 4 times per year    Active Member of [employer] or Organizations: No    Attends Banker Meetings: Never    Marital Status: Married    Tobacco Counseling Counseling given: Yes    Clinical Intake:  Pre-visit preparation completed: Yes  Pain : No/denies pain     Nutritional Risks: None Diabetes: No  Lab Results  Component Value Date   HGBA1C 5.3 06/24/2021   HGBA1C 5.4 03/01/2019   HGBA1C 5.2 04/25/2018     How often do you need to have someone help you when you read instructions, pamphlets, or other written materials from your doctor or pharmacy?: 1 - Never  Interpreter Needed?: No  Information entered by :: alia t/cma   Activities of Daily Living     09/14/2023   10:58 AM  In your present state of health, do you have any difficulty performing the following activities:  Hearing? 0   Vision? 0  Comment pt wear glasses/pt goes to [facility] in [place],/last ov 2025  Difficulty concentrating or making decisions? 0  Walking or climbing stairs? 0  Dressing or bathing? 0  Doing errands, shopping? 0  Preparing Food and eating ? N  Using the Toilet? N  In the past six months, have you accidently leaked urine? N  Do you have problems with loss of bowel control? N  Managing your Medications? N  Managing your Finances? N  Housekeeping or managing your Housekeeping? N    Patient Care Team: Jolinda Norene HERO, DO as PCP - General (Family Medicine)  I have updated your Care Teams any recent Medical Services you may have received from other providers in the  past year.     Assessment:   This is a routine wellness examination for Cleola.  Hearing/Vision screen Hearing Screening - Comments:: Pt denies hearing dif Vision Screening - Comments:: pt wear glasses/pt goes to [facility] in [place]/last ov 2025   Goals Addressed   None    Depression Screen     09/14/2023   11:05 AM 05/24/2023    8:50 AM 11/23/2022    3:39 PM 05/04/2022   10:47 AM 04/27/2022    9:53 AM 03/31/2022   10:17 AM 12/24/2021   11:23 AM  PHQ 2/9 Scores  PHQ - 2 Score 1 0 0 0 0 0 0  PHQ- 9 Score 1 0 0  0 0 1    Fall Risk     09/14/2023   10:58 AM 05/24/2023    8:50 AM 11/23/2022    3:41 PM 05/04/2022   10:03 AM 04/27/2022    9:51 AM  Fall Risk   Falls in the past year? 0 0 0 0 0  Number falls in past yr: 0 0 0 0 0  Injury with Fall? 0 0 0 0 0  Risk for fall due to : No Fall Risks No Fall Risks No Fall Risks Other (Comment) No Fall Risks  Risk for fall due to: Comment    Reports hx of muscle spasms   Follow up Falls evaluation completed Falls evaluation completed Education provided Falls prevention discussed Falls prevention discussed    MEDICARE RISK AT HOME:  Medicare Risk at Home Any stairs in or around the home?: Yes If so, are there any without handrails?: Yes Home free of loose  throw rugs in walkways, pet beds, electrical cords, etc?: Yes Adequate lighting in your home to reduce risk of falls?: Yes Life alert?: No Use of a cane, walker or w/c?: No Grab bars in the bathroom?: No Shower chair or bench in shower?: No Elevated toilet seat or a handicapped toilet?: No  TIMED UP AND GO:  Was the test performed?  no  Cognitive Function: 6CIT completed    04/14/2017   11:32 AM  MMSE - Mini Mental State Exam  Orientation to time 5  Orientation to Place 5  Registration 3  Attention/ Calculation 5  Recall 2  Language- name 2 objects 2  Language- repeat 1  Language- follow 3 step command 3  Language- read & follow direction 1  Write a sentence 1  Copy design 1  Total score 29        09/14/2023   11:07 AM 04/27/2022    9:54 AM 02/16/2020   11:24 AM 09/23/2018    8:37 AM  6CIT Screen  What Year? 0 points 0 points 0 points 0 points  What month? 0 points 0 points 0 points 0 points  What time? 0 points 0 points 0 points 0 points  Count back from 20 0 points 0 points 0 points 0 points  Months in reverse 0 points 0 points 0 points 0 points  Repeat phrase 6 points 0 points 0 points 0 points  Total Score 6 points 0 points 0 points 0 points    Immunizations Immunization History  Administered Date(s) Administered   Fluad Trivalent(High Dose 65+) 11/23/2022   Influenza, High Dose Seasonal PF 10/25/2018   Influenza, Seasonal, Injecte, Preservative Fre 12/03/2012   Influenza,inj,Quad PF,6+ Mos 03/10/2016   Influenza-Unspecified 12/03/2012   Moderna SARS-COV2 Booster Vaccination 03/06/2020   Moderna Sars-Covid-2 Vaccination 06/19/2019, 07/17/2019   PNEUMOCOCCAL CONJUGATE-20 06/24/2021  Pneumococcal Conjugate-13 04/14/2017   Tdap 06/27/2015    Screening Tests Health Maintenance  Topic Date Due   Zoster Vaccines- Shingrix (1 of 2) Never done   COVID-19 Vaccine (4 - 2024-25 season) 10/04/2022   DEXA SCAN  05/08/2023   INFLUENZA VACCINE  09/03/2023    Colonoscopy  11/23/2023 (Originally 11/23/2022)   Medicare Annual Wellness (AWV)  09/13/2024   DTaP/Tdap/Td (2 - Td or Tdap) 06/26/2025   Pneumococcal Vaccine: 50+ Years  Completed   Hepatitis C Screening  Completed   Hepatitis B Vaccines  Aged Out   HPV VACCINES  Aged Out   Meningococcal B Vaccine  Aged Out    Health Maintenance  Health Maintenance Due  Topic Date Due   Zoster Vaccines- Shingrix (1 of 2) Never done   COVID-19 Vaccine (4 - 2024-25 season) 10/04/2022   DEXA SCAN  05/08/2023   INFLUENZA VACCINE  09/03/2023   Health Maintenance Items Addressed: DEXA ordered  Additional Screening:  Vision Screening: Recommended annual ophthalmology exams for early detection of glaucoma and other disorders of the eye. Would you like a referral to an eye doctor? No    Dental Screening: Recommended annual dental exams for proper oral hygiene  Community Resource Referral / Chronic Care Management: CRR required this visit?  No   CCM required this visit?  No   Plan:    I have personally reviewed and noted the following in the patient's chart:   Medical and social history Use of alcohol, tobacco or illicit drugs  Current medications and supplements including opioid prescriptions. Patient is not currently taking opioid prescriptions. Functional ability and status Nutritional status Physical activity Advanced directives List of other physicians Hospitalizations, surgeries, and ER visits in previous 12 months Vitals Screenings to include cognitive, depression, and falls Referrals and appointments  In addition, I have reviewed and discussed with patient certain preventive protocols, quality metrics, and best practice recommendations. A written personalized care plan for preventive services as well as general preventive health recommendations were provided to patient.   Ozie Ned, CMA   09/14/2023   After Visit Summary: (MyChart) Due to this being a telephonic visit, the  after visit summary with patients personalized plan was offered to patient via MyChart   Notes: Nothing significant to report at this time.

## 2023-11-26 ENCOUNTER — Ambulatory Visit: Admitting: Family Medicine

## 2023-11-26 ENCOUNTER — Ambulatory Visit (INDEPENDENT_AMBULATORY_CARE_PROVIDER_SITE_OTHER)

## 2023-11-26 ENCOUNTER — Encounter: Payer: Self-pay | Admitting: Family Medicine

## 2023-11-26 VITALS — BP 139/73 | HR 64 | Temp 96.9°F | Ht 64.0 in | Wt 203.4 lb

## 2023-11-26 DIAGNOSIS — Z13820 Encounter for screening for osteoporosis: Secondary | ICD-10-CM

## 2023-11-26 DIAGNOSIS — I10 Essential (primary) hypertension: Secondary | ICD-10-CM | POA: Diagnosis not present

## 2023-11-26 DIAGNOSIS — Z636 Dependent relative needing care at home: Secondary | ICD-10-CM | POA: Diagnosis not present

## 2023-11-26 DIAGNOSIS — Z23 Encounter for immunization: Secondary | ICD-10-CM

## 2023-11-26 DIAGNOSIS — E7800 Pure hypercholesterolemia, unspecified: Secondary | ICD-10-CM | POA: Diagnosis not present

## 2023-11-26 DIAGNOSIS — M1712 Unilateral primary osteoarthritis, left knee: Secondary | ICD-10-CM

## 2023-11-26 DIAGNOSIS — Z0000 Encounter for general adult medical examination without abnormal findings: Secondary | ICD-10-CM

## 2023-11-26 DIAGNOSIS — Z780 Asymptomatic menopausal state: Secondary | ICD-10-CM

## 2023-11-26 DIAGNOSIS — N1831 Chronic kidney disease, stage 3a: Secondary | ICD-10-CM | POA: Diagnosis not present

## 2023-11-26 MED ORDER — DICLOFENAC SODIUM 1 % EX GEL: 4.0000 g | Freq: Four times a day (QID) | CUTANEOUS | 2 refills | Status: DC

## 2023-11-26 MED ORDER — DAPAGLIFLOZIN PROPANEDIOL 10 MG PO TABS: 10.0000 mg | ORAL_TABLET | Freq: Every day | ORAL | 3 refills | Status: DC

## 2023-11-26 MED ORDER — ROSUVASTATIN CALCIUM 10 MG PO TABS: 10.0000 mg | ORAL_TABLET | Freq: Every day | ORAL | 3 refills | Status: DC

## 2023-11-26 MED ORDER — BUSPIRONE HCL 5 MG PO TABS: 5.0000 mg | ORAL_TABLET | Freq: Two times a day (BID) | ORAL | 3 refills | Status: DC

## 2023-11-26 NOTE — Progress Notes (Signed)
 Subjective: CC: Follow-up stress PCP: Jolinda Norene HERO, DO YEP:Jodi Allen is a 83 y.o. female presenting to clinic today for:  Caregiver stress She continues to have a lot of caregiver stress.  Her husband suffers from dementia and seems to be progressively dependent on her.  She has help from her daughter and son sometimes but she notes that her daughter told her that she would no longer help her with him because he wanders.  Her son is currently taking care of him so that she can make it to today's appointment.  She needs more buspirone , which does work well to help manage her stress.  She tried to get personal care services and/or assistance at home but was told that they make too much money to have the services.  She is not sure if he is a candidate for respite care services.  He does well if she is around and therefore she is not planning on placing him at this time.  With regards to renal disease, she is tolerating the Farxiga  without difficulty.  May have had a little bit of vaginal irritation a few weeks ago but that is resolved on its own.  Urine output is baseline.  No hematuria or vaginal discharge   ROS: Per HPI  No Known Allergies Past Medical History:  Diagnosis Date   GERD (gastroesophageal reflux disease)    Hyperlipidemia    Osteopenia    Osteoporosis     Current Outpatient Medications:    busPIRone  (BUSPAR ) 5 MG tablet, Take 1 tablet (5 mg total) by mouth 2 (two) times daily. For stress/anxiety, Disp: 180 tablet, Rfl: 3   Cholecalciferol  1.25 MG (50000 UT) capsule, Take 1 capsule (50,000 Units total) by mouth every 7 (seven) days., Disp: 12 capsule, Rfl: 3   dapagliflozin  propanediol (FARXIGA ) 10 MG TABS tablet, Take 1 tablet (10 mg total) by mouth daily before breakfast., Disp: 90 tablet, Rfl: 3   diclofenac  Sodium (VOLTAREN ) 1 % GEL, Apply 4 g topically 4 (four) times daily. (knee pain).  If not covered please show her where the OTC version is, Disp: 400  g, Rfl: 2   rosuvastatin  (CRESTOR ) 10 MG tablet, Take 1 tablet (10 mg total) by mouth daily., Disp: 90 tablet, Rfl: 3   tiZANidine  (ZANAFLEX ) 4 MG tablet, Take 1 tablet (4 mg total) by mouth every 8 (eight) hours as needed for muscle spasms., Disp: 90 tablet, Rfl: 1 Social History   Socioeconomic History   Marital status: Married    Spouse name: Kayla   Number of children: 6   Years of education: 11   Highest education level: 11th grade  Occupational History   Occupation: retired    Associate Professor: [employer]  Tobacco Use   Smoking status: Never   Smokeless tobacco: Never  Vaping Use   Vaping status: Never Used  Substance and Sexual Activity   Alcohol use: No   Drug use: No   Sexual activity: Yes  Other Topics Concern   Not on file  Social History Narrative   Not on file   Social Drivers of Health   Financial Resource Strain: Low Risk  (09/14/2023)   Overall Financial Resource Strain (CARDIA)    Difficulty of Paying Living Expenses: Not hard at all  Food Insecurity: No Food Insecurity (09/14/2023)   Hunger Vital Sign    Worried About Running Out of Food in the Last Year: Never true    Ran Out of Food in the Last Year: Never true  Transportation Needs: No Transportation Needs (09/14/2023)   PRAPARE - Administrator, Civil Service (Medical): No    Lack of Transportation (Non-Medical): No  Physical Activity: Inactive (09/14/2023)   Exercise Vital Sign    Days of Exercise per Week: 0 days    Minutes of Exercise per Session: 0 min  Stress: No Stress Concern Present (09/14/2023)   [employer] of Occupational Health - Occupational Stress Questionnaire    Feeling of Stress: Only a little  Social Connections: Moderately Integrated (05/04/2022)   Social Connection and Isolation Panel    Frequency of Communication with Friends and Family: More than three times a week    Frequency of Social Gatherings with Friends and Family: More than three times a week    Attends Religious  Services: More than 4 times per year    Active Member of [employer] or Organizations: No    Attends Banker Meetings: Never    Marital Status: Married  Catering manager Violence: Not At Risk (04/27/2022)   Humiliation, Afraid, Rape, and Kick questionnaire    Fear of Current or Ex-Partner: No    Emotionally Abused: No    Physically Abused: No    Sexually Abused: No   Family History  Problem Relation Age of Onset   Cancer Mother        BREAST REMOVED   Breast cancer Mother    Leukemia Sister    Bladder Cancer Brother    Cancer Son    Arthritis Brother     Objective: Office vital signs reviewed. BP 139/73   Pulse 64   Temp (!) 96.9 F (36.1 C)   Ht 5' 4 (1.626 m)   Wt 203 lb 6 oz (92.3 kg)   SpO2 98%   BMI 34.91 kg/m   Physical Examination:  General: Awake, alert, well nourished, No acute distress HEENT: sclera white, MMM Cardio: regular rate and rhythm, S1S2 heard, soft murmurs appreciated Pulm: clear to auscultation bilaterally, no wheezes, rhonchi or rales; normal work of breathing on room air Extremities: Warm, well-perfused.  No edema  Assessment/ Plan: 83 y.o. female   Chronic kidney disease, stage 3a (HCC) - Plan: Renal Function Panel, dapagliflozin  propanediol (FARXIGA ) 10 MG TABS tablet  Essential hypertension - Plan: Renal Function Panel  Morbid obesity (HCC)  Caregiver stress - Plan: busPIRone  (BUSPAR ) 5 MG tablet  Primary osteoarthritis of left knee - Plan: diclofenac  Sodium (VOLTAREN ) 1 % GEL  Pure hypercholesterolemia - Plan: rosuvastatin  (CRESTOR ) 10 MG tablet  Encounter for immunization - Plan: Flu vaccine HIGH DOSE PF(Fluzone Trivalent)   Medications renewed.  Check renal function panel.  Blood pressure controlled upon recheck.  DEXA today  Continue to work on lifestyle modification to reduce weight.  BuSpar  renewed for as needed use.  Not yet due for fasting lipid but statin renewed.  Did not discuss lipid or OA during today's visit  but refills were needed  Influenza vaccination administered  Norene CHRISTELLA Fielding, DO [facility] [phone]

## 2023-11-27 LAB — RENAL FUNCTION PANEL
Albumin: 4 g/dL (ref 3.7–4.7)
BUN/Creatinine Ratio: 14 (ref 12–28)
BUN: 13 mg/dL (ref 8–27)
CO2: 24 mmol/L (ref 20–29)
Calcium: 9.2 mg/dL (ref 8.7–10.3)
Chloride: 101 mmol/L (ref 96–106)
Creatinine, Ser: 0.92 mg/dL (ref 0.57–1.00)
Glucose: 79 mg/dL (ref 70–99)
Phosphorus: 3 mg/dL (ref 3.0–4.3)
Potassium: 4.3 mmol/L (ref 3.5–5.2)
Sodium: 137 mmol/L (ref 134–144)
eGFR: 63 mL/min/1.73 (ref >59)

## 2023-11-29 ENCOUNTER — Ambulatory Visit: Payer: Self-pay | Admitting: Family Medicine

## 2023-11-30 ENCOUNTER — Ambulatory Visit: Payer: Self-pay | Admitting: Family Medicine

## 2024-02-07 ENCOUNTER — Other Ambulatory Visit (HOSPITAL_COMMUNITY): Payer: Self-pay

## 2024-02-08 ENCOUNTER — Other Ambulatory Visit (HOSPITAL_COMMUNITY): Payer: Self-pay

## 2024-02-09 ENCOUNTER — Encounter: Payer: Self-pay | Admitting: Family Medicine

## 2024-02-09 ENCOUNTER — Ambulatory Visit: Payer: Self-pay | Admitting: Family Medicine

## 2024-02-09 ENCOUNTER — Other Ambulatory Visit (HOSPITAL_COMMUNITY): Payer: Self-pay

## 2024-02-09 MED ORDER — TIZANIDINE HCL 4 MG PO TABS: 4.0000 mg | ORAL_TABLET | Freq: Three times a day (TID) | ORAL | 1 refills | Status: DC | PRN

## 2024-02-10 ENCOUNTER — Encounter: Payer: Self-pay | Admitting: Family

## 2024-02-10 ENCOUNTER — Other Ambulatory Visit: Payer: Self-pay | Admitting: *Deleted

## 2024-02-10 ENCOUNTER — Ambulatory Visit (INDEPENDENT_AMBULATORY_CARE_PROVIDER_SITE_OTHER): Payer: Self-pay | Admitting: Family

## 2024-02-10 ENCOUNTER — Other Ambulatory Visit (HOSPITAL_COMMUNITY): Payer: Self-pay

## 2024-02-10 ENCOUNTER — Telehealth: Payer: Self-pay

## 2024-02-10 ENCOUNTER — Other Ambulatory Visit: Payer: Self-pay

## 2024-02-10 ENCOUNTER — Ambulatory Visit: Payer: Self-pay | Admitting: Family

## 2024-02-10 VITALS — BP 143/70 | HR 75 | Temp 97.6°F | Ht 64.0 in | Wt 200.0 lb

## 2024-02-10 DIAGNOSIS — Z636 Dependent relative needing care at home: Secondary | ICD-10-CM

## 2024-02-10 DIAGNOSIS — N1831 Chronic kidney disease, stage 3a: Secondary | ICD-10-CM

## 2024-02-10 DIAGNOSIS — M545 Low back pain, unspecified: Secondary | ICD-10-CM

## 2024-02-10 DIAGNOSIS — E7800 Pure hypercholesterolemia, unspecified: Secondary | ICD-10-CM

## 2024-02-10 LAB — MICROSCOPIC EXAMINATION
RBC, Urine: NONE SEEN /HPF (ref 0–2)
Renal Epithel, UA: NONE SEEN /HPF
Yeast, UA: NONE SEEN

## 2024-02-10 LAB — URINALYSIS, COMPLETE
Bilirubin, UA: NEGATIVE
Nitrite, UA: NEGATIVE
Protein,UA: NEGATIVE
Specific Gravity, UA: 1.015 (ref 1.005–1.030)
Urobilinogen, Ur: 0.2 mg/dL (ref 0.2–1.0)
pH, UA: 7 (ref 5.0–7.5)

## 2024-02-10 MED ORDER — ROSUVASTATIN CALCIUM 10 MG PO TABS
10.0000 mg | ORAL_TABLET | Freq: Every day | ORAL | 3 refills | Status: AC
  Filled 2024-02-10 – 2024-02-11 (×2): qty 90, 90d supply, fill #0

## 2024-02-10 MED ORDER — TIZANIDINE HCL 4 MG PO TABS
4.0000 mg | ORAL_TABLET | Freq: Three times a day (TID) | ORAL | 1 refills | Status: AC | PRN
  Filled 2024-02-10: qty 90, 30d supply, fill #0

## 2024-02-10 MED ORDER — MELOXICAM 7.5 MG PO TABS: 7.5000 mg | ORAL_TABLET | Freq: Every day | ORAL | 0 refills | Status: DC

## 2024-02-10 MED ORDER — BUSPIRONE HCL 5 MG PO TABS
5.0000 mg | ORAL_TABLET | Freq: Two times a day (BID) | ORAL | 3 refills | Status: AC
  Filled 2024-02-10 – 2024-02-11 (×2): qty 180, 90d supply, fill #0

## 2024-02-10 MED ORDER — MELOXICAM 7.5 MG PO TABS
7.5000 mg | ORAL_TABLET | Freq: Every day | ORAL | 0 refills | Status: AC
  Filled 2024-02-10: qty 30, 30d supply, fill #0

## 2024-02-10 MED ORDER — DICLOFENAC SODIUM 1 % EX GEL: 4.0000 g | Freq: Four times a day (QID) | CUTANEOUS | 2 refills | Status: DC

## 2024-02-10 MED ORDER — DICLOFENAC SODIUM 1 % EX GEL
4.0000 g | Freq: Four times a day (QID) | CUTANEOUS | 2 refills | Status: AC
  Filled 2024-02-10: qty 400, 90d supply, fill #0
  Filled 2024-02-11: qty 400, 100d supply, fill #0

## 2024-02-10 MED ORDER — TIZANIDINE HCL 4 MG PO TABS: 4.0000 mg | ORAL_TABLET | Freq: Three times a day (TID) | ORAL | 1 refills | Status: DC | PRN

## 2024-02-10 MED ORDER — DAPAGLIFLOZIN PROPANEDIOL 10 MG PO TABS
10.0000 mg | ORAL_TABLET | Freq: Every day | ORAL | 3 refills | Status: AC
  Filled 2024-02-10 – 2024-02-11 (×2): qty 90, 90d supply, fill #0

## 2024-02-10 NOTE — Progress Notes (Signed)
 "  Subjective:    Patient ID: Jodi Allen, female    DOB: 03/15/1940, 84 y.o.   MRN: [id number]  Chief Complaint  Patient presents with   Back Pain    Low started Monday.    PT presents to the office today with lower back pain that started 3 days ago. Denies any injury, fever, or dysuria.  Reports husband has dementia and she is the main caregiver. Denies any recent pulling or pushing however.   Back Pain This is a new problem. The current episode started in the past 7 days. The problem occurs intermittently. The problem is unchanged. The pain is present in the lumbar spine. The quality of the pain is described as aching. The pain is at a severity of 8/10. The pain is mild. Pertinent negatives include no chest pain, dysuria, fever, headaches, leg pain, pelvic pain, tingling, weakness or weight loss. Risk factors include obesity. She has tried bed rest for the symptoms. The treatment provided no relief.      Review of Systems  Constitutional:  Negative for fever and weight loss.  Cardiovascular:  Negative for chest pain.  Genitourinary:  Negative for dysuria and pelvic pain.  Musculoskeletal:  Positive for back pain.  Neurological:  Negative for tingling, weakness and headaches.  All other systems reviewed and are negative.   Social History   Socioeconomic History   Marital status: Married    Spouse name: Kayla   Number of children: 6   Years of education: 11   Highest education level: 11th grade  Occupational History   Occupation: retired    Associate Professor: [employer]  Tobacco Use   Smoking status: Never   Smokeless tobacco: Never  Vaping Use   Vaping status: Never Used  Substance and Sexual Activity   Alcohol use: No   Drug use: No   Sexual activity: Yes  Other Topics Concern   Not on file  Social History Narrative   Not on file   Social Drivers of Health   Tobacco Use: Low Risk (02/10/2024)   Patient History    Smoking Tobacco Use: Never    Smokeless Tobacco Use:  Never    Passive Exposure: Not on file  Financial Resource Strain: Low Risk (09/14/2023)   Overall Financial Resource Strain (CARDIA)    Difficulty of Paying Living Expenses: Not hard at all  Food Insecurity: No Food Insecurity (09/14/2023)   Epic    Worried About Programme Researcher, Broadcasting/film/video in the Last Year: Never true    Ran Out of Food in the Last Year: Never true  Transportation Needs: No Transportation Needs (09/14/2023)   Epic    Lack of Transportation (Medical): No    Lack of Transportation (Non-Medical): No  Physical Activity: Inactive (09/14/2023)   Exercise Vital Sign    Days of Exercise per Week: 0 days    Minutes of Exercise per Session: 0 min  Stress: No Stress Concern Present (09/14/2023)   [employer] of Occupational Health - Occupational Stress Questionnaire    Feeling of Stress: Only a little  Social Connections: Moderately Integrated (05/04/2022)   Social Connection and Isolation Panel    Frequency of Communication with Friends and Family: More than three times a week    Frequency of Social Gatherings with Friends and Family: More than three times a week    Attends Religious Services: More than 4 times per year    Active Member of [employer] or Organizations: No    Attends Club or  Organization Meetings: Never    Marital Status: Married  Depression (PHQ2-9): Low Risk (02/10/2024)   Depression (PHQ2-9)    PHQ-2 Score: 0  Alcohol Screen: Low Risk (09/14/2023)   Alcohol Screen    Last Alcohol Screening Score (AUDIT): 0  Housing: Unknown (09/14/2023)   Epic    Unable to Pay for Housing in the Last Year: No    Number of Times Moved in the Last Year: Not on file    Homeless in the Last Year: No  Utilities: Not At Risk (09/14/2023)   Epic    Threatened with loss of utilities: No  Health Literacy: Adequate Health Literacy (09/14/2023)   B1300 Health Literacy    Frequency of need for help with medical instructions: Never   Family History  Problem Relation Age of Onset   Cancer  Mother        BREAST REMOVED   Breast cancer Mother    Leukemia Sister    Bladder Cancer Brother    Cancer Son    Arthritis Brother         Objective:   Physical Exam Vitals reviewed.  Constitutional:      General: She is not in acute distress.    Appearance: She is well-developed.  HENT:     Head: Normocephalic and atraumatic.  Eyes:     Pupils: Pupils are equal, round, and reactive to light.  Neck:     Thyroid : No thyromegaly.  Cardiovascular:     Rate and Rhythm: Normal rate and regular rhythm.     Heart sounds: Normal heart sounds. No murmur heard. Pulmonary:     Effort: Pulmonary effort is normal. No respiratory distress.     Breath sounds: Normal breath sounds. No wheezing.  Abdominal:     General: Bowel sounds are normal. There is no distension.     Palpations: Abdomen is soft.     Tenderness: There is no abdominal tenderness.  Musculoskeletal:        General: No tenderness. Normal range of motion.     Cervical back: Normal range of motion and neck supple.  Skin:    General: Skin is warm and dry.  Neurological:     Mental Status: She is alert and oriented to person, place, and time.     Cranial Nerves: No cranial nerve deficit.     Deep Tendon Reflexes: Reflexes are normal and symmetric.  Psychiatric:        Behavior: Behavior normal.        Thought Content: Thought content normal.        Judgment: Judgment normal.       BP (!) 143/70   Pulse 75   Temp 97.6 F (36.4 C) (Temporal)   Ht 5' 4 (1.626 m)   Wt 200 lb (90.7 kg)   BMI 34.33 kg/m      Assessment & Plan:  Beau Vanduzer Abreu comes in today with chief complaint of Back Pain (Low started Monday. )   Diagnosis and orders addressed:  1. Low back pain, unspecified back pain laterality, unspecified chronicity, unspecified whether sciatica present (Primary) Rest ROM exercises  Start mobic  daily for 7-10 days No other NSAID's  Zanaflex  as needed, sedation precautions  Follow up if  symptoms worsen or do not improve  - Urinalysis, Complete - tiZANidine  (ZANAFLEX ) 4 MG tablet; Take 1 tablet (4 mg total) by mouth every 8 (eight) hours as needed for muscle spasms.  Dispense: 90 tablet; Refill: 1 - diclofenac  Sodium (VOLTAREN )  1 % GEL; Apply 4 g topically 4 (four) times daily. (knee pain).  If not covered please show her where the OTC version is  Dispense: 400 g; Refill: 2 - meloxicam  (MOBIC ) 7.5 MG tablet; Take 1 tablet (7.5 mg total) by mouth daily.  Dispense: 30 tablet; Refill: 0     2. Caregiver stress Recommend trying to get family or friends to help to give her a break.     Bari Learn, FNP   "

## 2024-02-10 NOTE — Patient Instructions (Signed)
 Acute Back Pain, Adult Acute back pain is sudden and usually short-lived. It is often caused by an injury to the muscles and tissues in the back. The injury may result from: A muscle, tendon, or ligament getting overstretched or torn. Ligaments are tissues that connect bones to each other. Lifting something improperly can cause a back strain. Wear and tear (degeneration) of the spinal disks. Spinal disks are circular tissue that provide cushioning between the bones of the spine (vertebrae). Twisting motions, such as while playing sports or doing yard work. A hit to the back. Arthritis. You may have a physical exam, lab tests, and imaging tests to find the cause of your pain. Acute back pain usually goes away with rest and home care. Follow these instructions at home: Managing pain, stiffness, and swelling Take over-the-counter and prescription medicines only as told by your health care provider. Treatment may include medicines for pain and inflammation that are taken by mouth or applied to the skin, or muscle relaxants. Your health care provider may recommend applying ice during the first 24-48 hours after your pain starts. To do this: Put ice in a plastic bag. Place a towel between your skin and the bag. Leave the ice on for 20 minutes, 2-3 times a day. Remove the ice if your skin turns bright red. This is very important. If you cannot feel pain, heat, or cold, you have a greater risk of damage to the area. If directed, apply heat to the affected area as often as told by your health care provider. Use the heat source that your health care provider recommends, such as a moist heat pack or a heating pad. Place a towel between your skin and the heat source. Leave the heat on for 20-30 minutes. Remove the heat if your skin turns bright red. This is especially important if you are unable to feel pain, heat, or cold. You have a greater risk of getting burned. Activity  Do not stay in bed. Staying in  bed for more than 1-2 days can delay your recovery. Sit up and stand up straight. Avoid leaning forward when you sit or hunching over when you stand. If you work at a desk, sit close to it so you do not need to lean over. Keep your chin tucked in. Keep your neck drawn back, and keep your elbows bent at a 90-degree angle (right angle). Sit high and close to the steering wheel when you drive. Add lower back (lumbar) support to your car seat, if needed. Take short walks on even surfaces as soon as you are able. Try to increase the length of time you walk each day. Do not sit, drive, or stand in one place for more than 30 minutes at a time. Sitting or standing for long periods of time can put stress on your back. Do not drive or use heavy machinery while taking prescription pain medicine. Use proper lifting techniques. When you bend and lift, use positions that put less stress on your back: [place] your knees. Keep the load close to your body. Avoid twisting. Exercise regularly as told by your health care provider. Exercising helps your back heal faster and helps prevent back injuries by keeping muscles strong and flexible. Work with a physical therapist to make a safe exercise program, as recommended by your health care provider. Do any exercises as told by your physical therapist. Lifestyle Maintain a healthy weight. Extra weight puts stress on your back and makes it difficult to have good  posture. Avoid activities or situations that make you feel anxious or stressed. Stress and anxiety increase muscle tension and can make back pain worse. Learn ways to manage anxiety and stress, such as through exercise. General instructions Sleep on a firm mattress in a comfortable position. Try lying on your side with your knees slightly bent. If you lie on your back, put a pillow under your knees. Keep your head and neck in a straight line with your spine (neutral position) when using electronic equipment like  smartphones or pads. To do this: Raise your smartphone or pad to look at it instead of bending your head or neck to look down. Put the smartphone or pad at the level of your face while looking at the screen. Follow your treatment plan as told by your health care provider. This may include: Cognitive or behavioral therapy. Acupuncture or massage therapy. Meditation or yoga. Contact a health care provider if: You have pain that is not relieved with rest or medicine. You have increasing pain going down into your legs or buttocks. Your pain does not improve after 2 weeks. You have pain at night. You lose weight without trying. You have a fever or chills. You develop nausea or vomiting. You develop abdominal pain. Get help right away if: You develop new bowel or bladder control problems. You have unusual weakness or numbness in your arms or legs. You feel faint. These symptoms may represent a serious problem that is an emergency. Do not wait to see if the symptoms will go away. Get medical help right away. Call your local emergency services (911 in the [place] Do not drive yourself to the hospital. Summary Acute back pain is sudden and usually short-lived. Use proper lifting techniques. When you bend and lift, use positions that put less stress on your back. Take over-the-counter and prescription medicines only as told by your health care provider, and apply heat or ice as told. This information is not intended to replace advice given to you by your health care provider. Make sure you discuss any questions you have with your health care provider. Document Revised: 04/12/2020 Document Reviewed: 04/12/2020 Elsevier Patient Education  2024 [employer].

## 2024-02-10 NOTE — Telephone Encounter (Signed)
 Copied from CRM #[id number]. Topic: Clinical - Medication Question >> Feb 10, 2024  1:38 PM Alfonso ORN wrote: Reason for CRM: patient request to switch all her medication to the  [facility] pharmacy at [facility] long in [place]    Please contact patient to confirm changes been done

## 2024-02-10 NOTE — Telephone Encounter (Signed)
 All medications sent to [facility].  Patient notified

## 2024-02-11 ENCOUNTER — Other Ambulatory Visit (HOSPITAL_COMMUNITY): Payer: Self-pay

## 2024-02-11 ENCOUNTER — Other Ambulatory Visit: Payer: Self-pay

## 2024-02-20 ENCOUNTER — Other Ambulatory Visit (HOSPITAL_COMMUNITY): Payer: Self-pay

## 2024-05-26 ENCOUNTER — Encounter: Payer: Self-pay | Admitting: Family Medicine

## 2024-05-31 ENCOUNTER — Encounter

## 2024-09-14 ENCOUNTER — Ambulatory Visit: Payer: Self-pay
